# Patient Record
Sex: Male | Born: 2000 | Race: White | Hispanic: No | Marital: Single | Smoking: Never smoker
Health system: Southern US, Community
[De-identification: ages and names within clinical notes are randomized; demographics above are authoritative.]

## PROBLEM LIST (undated history)

## (undated) DIAGNOSIS — F329 Major depressive disorder, single episode, unspecified: Secondary | ICD-10-CM

## (undated) DIAGNOSIS — F32A Depression, unspecified: Secondary | ICD-10-CM

## (undated) DIAGNOSIS — F909 Attention-deficit hyperactivity disorder, unspecified type: Secondary | ICD-10-CM

## (undated) DIAGNOSIS — F419 Anxiety disorder, unspecified: Secondary | ICD-10-CM

## (undated) DIAGNOSIS — M4850XA Collapsed vertebra, not elsewhere classified, site unspecified, initial encounter for fracture: Secondary | ICD-10-CM

## (undated) HISTORY — DX: Major depressive disorder, single episode, unspecified: F32.9

## (undated) HISTORY — DX: Attention-deficit hyperactivity disorder, unspecified type: F90.9

## (undated) HISTORY — PX: NO PAST SURGERIES: SHX2092

## (undated) HISTORY — DX: Collapsed vertebra, not elsewhere classified, site unspecified, initial encounter for fracture: M48.50XA

## (undated) HISTORY — DX: Depression, unspecified: F32.A

## (undated) HISTORY — DX: Anxiety disorder, unspecified: F41.9

---
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
## 2020-01-14 ENCOUNTER — Other Ambulatory Visit: Payer: Self-pay

## 2020-01-14 ENCOUNTER — Telehealth (INDEPENDENT_AMBULATORY_CARE_PROVIDER_SITE_OTHER): Payer: BC Managed Care – PPO | Admitting: Psychiatry

## 2020-01-14 DIAGNOSIS — F3181 Bipolar II disorder: Secondary | ICD-10-CM

## 2020-01-14 DIAGNOSIS — F902 Attention-deficit hyperactivity disorder, combined type: Secondary | ICD-10-CM

## 2020-01-14 MED ORDER — ARIPIPRAZOLE 5 MG PO TABS: 10.0000 mg | ORAL_TABLET | Freq: Every day | ORAL | 1 refills | Status: DC

## 2020-01-14 NOTE — Progress Notes (Signed)
BH MD/PA/NP OP Progress Note  01/14/2020 3:48 PM Shane [name]  MRN:  [id number] Interview was conducted using teleconferencing application and I verified that I was speaking with the correct person using two identifiers. I discussed the limitations of evaluation and management by telemedicine and  the availability of in person appointments. Patient expressed understanding and agreed to proceed.  Chief Complaint: "I am doing well but have gained weight".  HPI: 19yo single white male with a hx of mood swings, irritability, anger then some depression/anxiety consistent with bipolar spectrum disorder. He also has been diagnosed with ADHD and was in the past on Vyvanse (suppressed appetite) and is doing well on Strattera. He has a hx of cannabis abuse but has not been using any drugs in over 2 years. He stopped taking his meds "for a while" and this caused reemergence ofproblems with mood being more irritable, more angry outbursts,impulsiveand had excessive spending.In mid June 2020 he resumed taking medsconsistently with parent supervising, and his moodstarted to improve. Hewas obsessing about his health and we increased Prozac further to 60 mg. While obsessive tendencies subsided his mood became unstable - more irritability, renewed problems with compliance. After we decreased Prozac to 40 mg and increased Lamictal to 200 mg his mood normalized. His meds includeLamictal 200mg  qam,Abilify 10 mg qam, fluoxetine 40 mg andStrattera 80mg  qam.  He has a tendency to obsess/ruminate about his health - hypochondriasis not a major issue at this time. Parents are supportive and he can continue to live at home as long as he follows rules including taking his meds.He reports feeling emotionally stable at this time. He is sleeping well at night without daytime sedation. He stays active - works at . He has gained some weight and admits to spending "too much money on food". In the past when he was  on 5 mg of aripiprazole it was less of a problem.   Visit Diagnosis:    ICD-10-CM   1. Bipolar 2 disorder (HCC)  F31.81   2. Attention deficit hyperactivity disorder (ADHD), combined type  F90.2     Past Psychiatric History: Please see intake H&P.  Past Medical History:  Past Medical History:  Diagnosis Date  . ADHD   . Anxiety   . Compressed spine fracture (HCC) 2015   L6  . Depression     Past Surgical History:  Procedure Laterality Date  . NO PAST SURGERIES      Family Psychiatric History: Reviewed.  Family History:  Family History  Problem Relation Age of Onset  . Thyroid disease Mother   . Anxiety disorder Mother   . Depression Mother   . Hypertension Father   . Hyperlipidemia Father   . Hypertension Paternal Grandmother   . Hyperlipidemia Paternal Grandfather     Social History:  Social History   Socioeconomic History  . Marital status: Single    Spouse name: Not on file  . Number of children: Not on file  . Years of education: Not on file  . Highest education level: Not on file  Occupational History  . Not on file  Tobacco Use  . Smoking status: Never Smoker  . Smokeless tobacco: Never Used  Substance and Sexual Activity  . Alcohol use: No  . Drug use: No  . Sexual activity: Never  Other Topics Concern  . Not on file  Social History Narrative  . Not on file   Social Determinants of Health   Financial Resource Strain:   . Difficulty of  Paying Living Expenses:   Food Insecurity:   . Worried About Charity fundraiser in the Last Year:   . Arboriculturist in the Last Year:   Transportation Needs:   . Film/video editor (Medical):   Marland Kitchen Lack of Transportation (Non-Medical):   Physical Activity:   . Days of Exercise per Week:   . Minutes of Exercise per Session:   Stress:   . Feeling of Stress :   Social Connections:   . Frequency of Communication with Friends and Family:   . Frequency of Social Gatherings with Friends and Family:    . Attends Religious Services:   . Active Member of Clubs or Organizations:   . Attends Archivist Meetings:   Marland Kitchen Marital Status:     Allergies:  Allergies  Allergen Reactions  . Amoxicillin   . Cantaloupe (Diagnostic)     Metabolic Disorder Labs: Lab Results  Component Value Date   HGBA1C 5.0 05/02/2017   MPG 97 05/02/2017   No results found for: PROLACTIN Lab Results  Component Value Date   CHOL 162 05/02/2017   TRIG 80 05/02/2017   HDL 37 (L) 05/02/2017   CHOLHDL 4.4 05/02/2017   Lab Results  Component Value Date   TSH 2.44 09/20/2018    Therapeutic Level Labs: No results found for: LITHIUM No results found for: VALPROATE No components found for:  CBMZ  Current Medications: Current Outpatient Medications  Medication Sig Dispense Refill  . ARIPiprazole (ABILIFY) 5 MG tablet Take 2 tablets (10 mg total) by mouth daily. 90 tablet 1  . atomoxetine (STRATTERA) 80 MG capsule Take 1 capsule (80 mg total) by mouth daily. 30 capsule 5  . FLUoxetine (PROZAC) 40 MG capsule Take 1 capsule (40 mg total) by mouth daily. 90 capsule 0  . fluticasone (FLONASE) 50 MCG/ACT nasal spray Place 1 spray into both nostrils daily. 16 g 0  . ibuprofen (ADVIL,MOTRIN) 800 MG tablet Take 1 tablet (800 mg total) by mouth 3 (three) times daily. 21 tablet 0  . lamoTRIgine (LAMICTAL) 200 MG tablet Take 1 tablet (200 mg total) by mouth at bedtime. 90 tablet 0  . loratadine (CLARITIN) 10 MG tablet Take 10 mg by mouth daily.    . meloxicam (MOBIC) 15 MG tablet Take 1 tablet (15 mg total) by mouth daily. 30 tablet 0   No current facility-administered medications for this visit.    Psychiatric Specialty Exam: Review of Systems  Constitutional: Positive for appetite change.  All other systems reviewed and are negative.   There were no vitals taken for this visit.There is no height or weight on file to calculate BMI.  General Appearance: Casual and Fairly Groomed  Eye Contact:  Good   Speech:  Clear and Coherent and Normal Rate  Volume:  Normal  Mood:  Euthymic  Affect:  Full Range  Thought Process:  Goal Directed and Linear  Orientation:  Full (Time, Place, and Person)  Thought Content: Logical   Suicidal Thoughts:  No  Homicidal Thoughts:  No  Memory:  Immediate;   Good Recent;   Good Remote;   Good  Judgement:  Fair  Insight:  Fair  Psychomotor Activity:  Normal  Concentration:  Concentration: Good  Recall:  Good  Fund of Knowledge: Good  Language: Good  Akathisia:  Negative  Handed:  Right  AIMS (if indicated): not done  Assets:  Communication Skills Desire for Improvement Financial Resources/Insurance Housing Physical Health Social Support  ADL's:  Intact  Cognition: WNL  Sleep:  Good   Screenings: GAD-7     Counselor from 01/09/2017 in BEHAVIORAL HEALTH OUTPATIENT THERAPY [place]  Total GAD-7 Score  11    PHQ2-9     Counselor from 01/09/2017 in BEHAVIORAL HEALTH OUTPATIENT THERAPY [place]  PHQ-2 Total Score  1  PHQ-9 Total Score  6       Assessment and Plan: 19yo single white male with a hx of mood swings, irritability, anger then some depression/anxiety consistent with bipolar spectrum disorder. He also has been diagnosed with ADHD and was in the past on Vyvanse (suppressed appetite) and is doing well on Strattera. He has a hx of cannabis abuse but has not been using any drugs in over 2 years. He stopped taking his meds "for a while" and this caused reemergence ofproblems with mood being more irritable, more angry outbursts,impulsiveand had excessive spending.In mid June 2020 he resumed taking medsconsistently with parent supervising, and his moodstarted to improve. Hewas obsessing about his health and we increased Prozac further to 60 mg. While obsessive tendencies subsided his mood became unstable - more irritability, renewed problems with compliance. After we decreased Prozac to 40 mg and increased Lamictal to 200 mg his mood  normalized. His meds includeLamictal 200mg  qam,Abilify 10 mg qam, fluoxetine 40 mg andStrattera 80mg  qam.  He has a tendency to obsess/ruminate about his health - hypochondriasis not a major issue at this time. Parents are supportive and he can continue to live at home as long as he follows rules including taking his meds.He reports feeling emotionally stable at this time. He is sleeping well at night without daytime sedation. He stays active - works at . He has gained some weight and admits to spending "too much money on food". In the past when he was on 5 mg of aripiprazole it was less of a problem.  Dx: ADHD combined;Bipolar 2 disorder.  Plan:Continue Prozacto 40 mg,Lamictalto 200 mg at HS, decrease Abilifyto 5 mg in AM andcontinue Strattera 80mg  qam.Next appointment intwomonths.The plan was discussed with patientwho had an opportunity to ask questions and these were all answered. I spend51minutes inphone consultationwith the patient.    [employer], MD 01/14/2020, 3:48 PM

## 2020-02-06 ENCOUNTER — Other Ambulatory Visit (HOSPITAL_COMMUNITY): Payer: Self-pay | Admitting: Psychiatry

## 2020-03-09 ENCOUNTER — Other Ambulatory Visit (HOSPITAL_COMMUNITY): Payer: Self-pay | Admitting: Psychiatry

## 2020-03-17 ENCOUNTER — Telehealth (HOSPITAL_COMMUNITY): Payer: BC Managed Care – PPO | Admitting: Psychiatry

## 2020-03-17 ENCOUNTER — Other Ambulatory Visit: Payer: Self-pay

## 2020-04-01 ENCOUNTER — Other Ambulatory Visit: Payer: Self-pay

## 2020-04-01 ENCOUNTER — Ambulatory Visit (INDEPENDENT_AMBULATORY_CARE_PROVIDER_SITE_OTHER): Payer: BC Managed Care – PPO | Admitting: Family Medicine

## 2020-04-01 ENCOUNTER — Encounter: Payer: Self-pay | Admitting: Family Medicine

## 2020-04-01 VITALS — BP 118/84 | HR 84 | Temp 99.1°F | Ht 70.0 in | Wt 207.1 lb

## 2020-04-01 DIAGNOSIS — Z0000 Encounter for general adult medical examination without abnormal findings: Secondary | ICD-10-CM | POA: Diagnosis not present

## 2020-04-01 DIAGNOSIS — Z1159 Encounter for screening for other viral diseases: Secondary | ICD-10-CM

## 2020-04-01 DIAGNOSIS — Z114 Encounter for screening for human immunodeficiency virus [HIV]: Secondary | ICD-10-CM | POA: Diagnosis not present

## 2020-04-01 NOTE — Patient Instructions (Addendum)
Give us 2-3 business days to get the results of your labs back.   Keep the diet clean and stay active.  Do monthly self testicular checks in the shower. You are feeling for lumps/bumps that don't belong. If you feel anything like this, let me know!  Let us know if you need anything.  Healthy Eating Plan Many factors influence your heart health, including eating and exercise habits. Heart (coronary) risk increases with abnormal blood fat (lipid) levels. Heart-healthy meal planning includes limiting unhealthy fats, increasing healthy fats, and making other small dietary changes. This includes maintaining a healthy body weight to help keep lipid levels within a normal range.  WHAT IS MY PLAN?  Your health care provider recommends that you: Drink a glass of water before meals to help with satiety. Eat slowly. An alternative to the water is to add Metamucil. This will help with satiety as well. It does contain calories, unlike water.  WHAT TYPES OF FAT SHOULD I CHOOSE? Choose healthy fats more often. Choose monounsaturated and polyunsaturated fats, such as olive oil and canola oil, flaxseeds, walnuts, almonds, and seeds. Eat more omega-3 fats. Good choices include salmon, mackerel, sardines, tuna, flaxseed oil, and ground flaxseeds. Aim to eat fish at least two times each week. Avoid foods with partially hydrogenated oils in them. These contain trans fats. Examples of foods that contain trans fats are stick margarine, some tub margarines, cookies, crackers, and other baked goods. If you are going to avoid a fat, this is the one to avoid!  WHAT GENERAL GUIDELINES DO I NEED TO FOLLOW? Check food labels carefully to identify foods with trans fats. Avoid these types of options when possible. Fill one half of your plate with vegetables and green salads. Eat 4-5 servings of vegetables per day. A serving of vegetables equals 1 cup of raw leafy vegetables,  cup of raw or cooked cut-up vegetables, or   cup of vegetable juice. Fill one fourth of your plate with whole grains. Look for the word "whole" as the first word in the ingredient list. Fill one fourth of your plate with lean protein foods. Eat 4-5 servings of fruit per day. A serving of fruit equals one medium whole fruit,  cup of dried fruit,  cup of fresh, frozen, or canned fruit. Try to avoid fruits in cups/syrups as the sugar content can be high. Eat more foods that contain soluble fiber. Examples of foods that contain this type of fiber are apples, broccoli, carrots, beans, peas, and barley. Aim to get 20-30 g of fiber per day. Eat more home-cooked food and less restaurant, buffet, and fast food. Limit or avoid alcohol. Limit foods that are high in starch and sugar. Avoid fried foods when able. Cook foods by using methods other than frying. Baking, boiling, grilling, and broiling are all great options. Other fat-reducing suggestions include: Removing the skin from poultry. Removing all visible fats from meats. Skimming the fat off of stews, soups, and gravies before serving them. Steaming vegetables in water or broth. Lose weight if you are overweight. Losing just 5-10% of your initial body weight can help your overall health and prevent diseases such as diabetes and heart disease. Increase your consumption of nuts, legumes, and seeds to 4-5 servings per week. One serving of dried beans or legumes equals  cup after being cooked, one serving of nuts equals 1 ounces, and one serving of seeds equals  ounce or 1 tablespoon.  WHAT ARE GOOD FOODS CAN I EAT? Grains Grainy   equals 1 ounces, and one serving of seeds equals  ounce or 1 tablespoon.  WHAT ARE GOOD FOODS CAN I EAT? Grains Grainy breads (try to find bread that is 3 g of fiber per slice or greater), oatmeal, light popcorn. Whole-grain cereals. Rice and pasta, including brown rice and those that are made with whole wheat. Edamame pasta is a great alternative to grain pasta. It has a higher protein content. Try to avoid significant consumption of white  bread, sugary cereals, or pastries/baked goods.  Vegetables All vegetables. Cooked white potatoes do not count as vegetables.  Fruits All fruits, but limit pineapple and bananas as these fruits have a higher sugar content.  Meats and Other Protein Sources Lean, well-trimmed beef, veal, pork, and lamb. Chicken and [place] without skin. All fish and shellfish. Wild duck, rabbit, pheasant, and venison. Egg whites or low-cholesterol egg substitutes. Dried beans, peas, lentils, and tofu.Seeds and most nuts.  Dairy Low-fat or nonfat cheeses, including ricotta, string, and mozzarella. Skim or 1% milk that is liquid, powdered, or evaporated. Buttermilk that is made with low-fat milk. Nonfat or low-fat yogurt. Soy/Almond milk are good alternatives if you cannot handle dairy.  Beverages Water is the best for you. Sports drinks with less sugar are more desirable unless you are a highly active athlete.  Sweets and Desserts Sherbets and fruit ices. Honey, jam, marmalade, jelly, and syrups. Dark chocolate.  Eat all sweets and desserts in moderation.  Fats and Oils Nonhydrogenated (trans-free) margarines. Vegetable oils, including soybean, sesame, sunflower, olive, peanut, safflower, corn, canola, and cottonseed. Salad dressings or mayonnaise that are made with a vegetable oil. Limit added fats and oils that you use for cooking, baking, salads, and as spreads.  Other Cocoa powder. Coffee and tea. Most condiments.  The items listed above may not be a complete list of recommended foods or beverages. Contact your dietitian for more options.

## 2020-04-01 NOTE — Progress Notes (Signed)
Chief Complaint  Patient presents with   Annual Exam    Concerned about BP and diet change    Well Male Shane [name] is here for a complete physical.   His last physical was >1 year ago.  Current diet: in general, an "unhealthy" diet.   Current exercise: plays basketball Weight trend: stable Does pt snore? No. Daytime fatigue? No. Seat belt? Yes.    Health maintenance Tetanus- Yes HIV- No  Past Medical History:  Diagnosis Date   ADHD    Anxiety    Compressed spine fracture (HCC) 2015   L6   Depression      Past Surgical History:  Procedure Laterality Date   NO PAST SURGERIES      Medications  Current Outpatient Medications on File Prior to Visit  Medication Sig Dispense Refill   ARIPiprazole (ABILIFY) 5 MG tablet Take 2 tablets (10 mg total) by mouth daily. 90 tablet 1   atomoxetine (STRATTERA) 80 MG capsule TAKE 1 CAPSULE BY MOUTH EVERY DAY 90 capsule 1   FLUoxetine (PROZAC) 40 MG capsule TAKE 1 CAPSULE BY MOUTH EVERY DAY 90 capsule 0   fluticasone (FLONASE) 50 MCG/ACT nasal spray Place 1 spray into both nostrils daily. 16 g 0   ibuprofen (ADVIL,MOTRIN) 800 MG tablet Take 1 tablet (800 mg total) by mouth 3 (three) times daily. 21 tablet 0   lamoTRIgine (LAMICTAL) 200 MG tablet TAKE 1 TABLET (200 MG TOTAL) BY MOUTH AT BEDTIME. 90 tablet 0   loratadine (CLARITIN) 10 MG tablet Take 10 mg by mouth daily.     Allergies Allergies  Allergen Reactions   Amoxicillin    Cantaloupe (Diagnostic)     Family History Family History  Problem Relation Age of Onset   Thyroid disease Mother    Anxiety disorder Mother    Depression Mother    Hypertension Father    Hyperlipidemia Father    Hypertension Paternal Grandmother    Hyperlipidemia Paternal Grandfather     Review of Systems: Constitutional: no fevers or chills Eye:  no recent significant change in vision Ear/Nose/Mouth/Throat:  Ears:  no hearing loss Nose/Mouth/Throat:  no complaints  of nasal congestion, no sore throat Cardiovascular:  No current chest pain Respiratory:  no shortness of breath Gastrointestinal:  no abdominal pain, no change in bowel habits GU:  Male: negative for dysuria, frequency, and incontinence Musculoskeletal/Extremities:  no pain of the joints Integumentary (Skin/Breast):  no abnormal skin lesions reported Neurologic:  no headaches Endocrine: No unexpected weight changes Hematologic/Lymphatic:  no night sweats  Exam BP 118/84 (BP Location: Left Arm, Patient Position: Sitting, Cuff Size: Normal)    Pulse 84    Temp 99.1 F (37.3 C) (Oral)    Ht 5\' 10"  (1.778 m)    Wt 207 lb 2 oz (94 kg)    SpO2 96%    BMI 29.72 kg/m  General:  well developed, well nourished, in no apparent distress Skin:  no significant moles, warts, or growths Head:  no masses, lesions, or tenderness Eyes:  pupils equal and round, sclera anicteric without injection Ears:  canals without lesions, TMs shiny without retraction, no obvious effusion, no erythema Nose:  nares patent, septum midline, mucosa normal Throat/Pharynx:  lips and gingiva without lesion; tongue and uvula midline; non-inflamed pharynx; no exudates or postnasal drainage Neck: neck supple without adenopathy, thyromegaly, or masses Lungs:  clear to auscultation, breath sounds equal bilaterally, no respiratory distress Cardio:  regular rate and rhythm, no bruits, no LE edema Abdomen:  abdomen soft, nontender; bowel sounds normal; no masses or organomegaly Rectal: Deferred Musculoskeletal:  symmetrical muscle groups noted without atrophy or deformity Extremities:  no clubbing, cyanosis, or edema, no deformities, no skin discoloration Neuro:  gait normal; deep tendon reflexes normal and symmetric Psych: well oriented with normal range of affect and appropriate judgment/insight  Assessment and Plan  Well adult exam - Plan: Hemoglobin A1c, Lipid panel, CBC, Comprehensive metabolic panel  Encounter for  hepatitis C screening test for low risk patient - Plan: Hepatitis C antibody  Screening for HIV (human immunodeficiency virus) - Plan: HIV Antibody (routine testing w rflx)   Well 19 y.o. male. Counseled on diet and exercise. Self testicular exams recommended at least monthly.  BP OK today. Monitor at home. Let us know if high and we can have nurse visit to ck accuracy of monitor.  Other orders as above. Follow up in 1 year pending the above workup. The patient voiced understanding and agreement to the plan.  Jilda Roche [place], DO 04/01/20 3:35 PM

## 2020-04-02 LAB — LIPID PANEL
Cholesterol: 208 mg/dL — ABNORMAL HIGH (ref 0–200)
HDL: 40.9 mg/dL (ref >39.00)
LDL Cholesterol: 156 mg/dL — ABNORMAL HIGH (ref 0–99)
NonHDL: 166.68
Total CHOL/HDL Ratio: 5
Triglycerides: 51 mg/dL (ref 0.0–149.0)
VLDL: 10.2 mg/dL (ref 0.0–40.0)

## 2020-04-02 LAB — COMPREHENSIVE METABOLIC PANEL
ALT: 18 U/L (ref 0–53)
AST: 17 U/L (ref 0–37)
Albumin: 4.9 g/dL (ref 3.5–5.2)
Alkaline Phosphatase: 95 U/L (ref 52–171)
BUN: 24 mg/dL — ABNORMAL HIGH (ref 6–23)
CO2: 30 mEq/L (ref 19–32)
Calcium: 9.8 mg/dL (ref 8.4–10.5)
Chloride: 103 mEq/L (ref 96–112)
Creatinine, Ser: 1.31 mg/dL (ref 0.40–1.50)
GFR: 70.1 mL/min (ref >60.00)
Glucose, Bld: 99 mg/dL (ref 70–99)
Potassium: 5.1 mEq/L (ref 3.5–5.1)
Sodium: 141 mEq/L (ref 135–145)
Total Bilirubin: 0.7 mg/dL (ref 0.2–1.2)
Total Protein: 6.9 g/dL (ref 6.0–8.3)

## 2020-04-02 LAB — HIV ANTIBODY (ROUTINE TESTING W REFLEX): HIV 1&2 Ab, 4th Generation: NONREACTIVE

## 2020-04-02 LAB — CBC
HCT: 44.5 % (ref 36.0–49.0)
Hemoglobin: 15.4 g/dL (ref 12.0–16.0)
MCHC: 34.6 g/dL (ref 31.0–37.0)
MCV: 94.1 fl (ref 78.0–98.0)
Platelets: 210 10*3/uL (ref 150.0–575.0)
RBC: 4.73 Mil/uL (ref 3.80–5.70)
RDW: 12.4 % (ref 11.4–15.5)
WBC: 5.4 10*3/uL (ref 4.5–13.5)

## 2020-04-02 LAB — HEPATITIS C ANTIBODY
Hepatitis C Ab: NONREACTIVE
SIGNAL TO CUT-OFF: 0.03 (ref <1.00)

## 2020-04-02 LAB — HEMOGLOBIN A1C: Hgb A1c MFr Bld: 5.4 % (ref 4.6–6.5)

## 2020-04-03 ENCOUNTER — Other Ambulatory Visit: Payer: Self-pay | Admitting: Family Medicine

## 2020-04-03 DIAGNOSIS — E785 Hyperlipidemia, unspecified: Secondary | ICD-10-CM

## 2020-04-09 ENCOUNTER — Other Ambulatory Visit (HOSPITAL_COMMUNITY): Payer: Self-pay | Admitting: Psychiatry

## 2020-04-09 MED ORDER — ARIPIPRAZOLE 5 MG PO TABS
5.0000 mg | ORAL_TABLET | Freq: Every day | ORAL | 1 refills | Status: DC
Start: 2020-04-09 — End: 2020-07-15

## 2020-04-24 ENCOUNTER — Other Ambulatory Visit (HOSPITAL_COMMUNITY): Payer: Self-pay | Admitting: Psychiatry

## 2020-04-29 ENCOUNTER — Other Ambulatory Visit (HOSPITAL_COMMUNITY): Payer: Self-pay | Admitting: Psychiatry

## 2020-05-07 ENCOUNTER — Emergency Department (HOSPITAL_COMMUNITY)
Admission: EM | Admit: 2020-05-07 | Discharge: 2020-05-07 | Disposition: A | Discharge status: Home / Self Care | Payer: BC Managed Care – PPO | Attending: Emergency Medicine | Admitting: Emergency Medicine

## 2020-05-07 ENCOUNTER — Other Ambulatory Visit: Payer: Self-pay

## 2020-05-07 DIAGNOSIS — Z5321 Procedure and treatment not carried out due to patient leaving prior to being seen by health care provider: Secondary | ICD-10-CM | POA: Diagnosis not present

## 2020-05-07 DIAGNOSIS — S0990XA Unspecified injury of head, initial encounter: Secondary | ICD-10-CM | POA: Diagnosis present

## 2020-05-07 DIAGNOSIS — W2105XA Struck by basketball, initial encounter: Secondary | ICD-10-CM | POA: Diagnosis not present

## 2020-05-07 DIAGNOSIS — R519 Headache, unspecified: Secondary | ICD-10-CM | POA: Diagnosis not present

## 2020-05-07 DIAGNOSIS — Y92310 Basketball court as the place of occurrence of the external cause: Secondary | ICD-10-CM | POA: Diagnosis not present

## 2020-05-07 DIAGNOSIS — Y999 Unspecified external cause status: Secondary | ICD-10-CM | POA: Insufficient documentation

## 2020-05-07 DIAGNOSIS — R55 Syncope and collapse: Secondary | ICD-10-CM | POA: Diagnosis not present

## 2020-05-07 DIAGNOSIS — R110 Nausea: Secondary | ICD-10-CM | POA: Diagnosis not present

## 2020-05-07 DIAGNOSIS — Y9367 Activity, basketball: Secondary | ICD-10-CM | POA: Insufficient documentation

## 2020-05-07 DIAGNOSIS — S0181XA Laceration without foreign body of other part of head, initial encounter: Secondary | ICD-10-CM | POA: Insufficient documentation

## 2020-05-07 DIAGNOSIS — R410 Disorientation, unspecified: Secondary | ICD-10-CM | POA: Insufficient documentation

## 2020-05-07 LAB — BASIC METABOLIC PANEL
Anion gap: 9 (ref 5–15)
BUN: 18 mg/dL (ref 6–20)
CO2: 26 mmol/L (ref 22–32)
Calcium: 9.1 mg/dL (ref 8.9–10.3)
Chloride: 104 mmol/L (ref 98–111)
Creatinine, Ser: 1.13 mg/dL (ref 0.61–1.24)
GFR calc Af Amer: >60 mL/min (ref >60)
GFR calc non Af Amer: >60 mL/min (ref >60)
Glucose, Bld: 92 mg/dL (ref 70–99)
Potassium: 4.4 mmol/L (ref 3.5–5.1)
Sodium: 139 mmol/L (ref 135–145)

## 2020-05-07 LAB — CBC
HCT: 46.4 % (ref 39.0–52.0)
Hemoglobin: 15.6 g/dL (ref 13.0–17.0)
MCH: 31.8 pg (ref 26.0–34.0)
MCHC: 33.6 g/dL (ref 30.0–36.0)
MCV: 94.5 fL (ref 80.0–100.0)
Platelets: 214 10*3/uL (ref 150–400)
RBC: 4.91 MIL/uL (ref 4.22–5.81)
RDW: 11.9 % (ref 11.5–15.5)
WBC: 6 10*3/uL (ref 4.0–10.5)
nRBC: 0 % (ref 0.0–0.2)

## 2020-05-07 NOTE — ED Triage Notes (Signed)
Pt was hit in the head while moving a basketball goal last night (small lac to R forehead). Headache when he went to bed last night. Woke up this morning with headache and nausea. Had syncopal episode at work this morning while standing up to urinate. Still c/o headache, nausea, and has some mild confusion. C collar still in place. 4 mg zofran given PTA.

## 2020-05-08 ENCOUNTER — Ambulatory Visit (HOSPITAL_BASED_OUTPATIENT_CLINIC_OR_DEPARTMENT_OTHER)
Admission: RE | Admit: 2020-05-08 | Discharge: 2020-05-08 | Disposition: A | Discharge status: Home / Self Care | Payer: BC Managed Care – PPO | Source: Ambulatory Visit | Attending: Medical | Admitting: Medical

## 2020-05-08 ENCOUNTER — Ambulatory Visit (INDEPENDENT_AMBULATORY_CARE_PROVIDER_SITE_OTHER): Payer: BC Managed Care – PPO | Admitting: Medical

## 2020-05-08 VITALS — BP 124/80 | HR 107 | Resp 18 | Ht 70.0 in | Wt 206.4 lb

## 2020-05-08 DIAGNOSIS — R519 Headache, unspecified: Secondary | ICD-10-CM

## 2020-05-08 DIAGNOSIS — M546 Pain in thoracic spine: Secondary | ICD-10-CM | POA: Insufficient documentation

## 2020-05-08 DIAGNOSIS — M545 Low back pain, unspecified: Secondary | ICD-10-CM

## 2020-05-08 DIAGNOSIS — R55 Syncope and collapse: Secondary | ICD-10-CM | POA: Diagnosis not present

## 2020-05-08 DIAGNOSIS — R42 Dizziness and giddiness: Secondary | ICD-10-CM | POA: Insufficient documentation

## 2020-05-08 DIAGNOSIS — J3489 Other specified disorders of nose and nasal sinuses: Secondary | ICD-10-CM

## 2020-05-08 LAB — COMPREHENSIVE METABOLIC PANEL
AG Ratio: 2.3 (calc) (ref 1.0–2.5)
ALT: 18 U/L (ref 8–46)
AST: 18 U/L (ref 12–32)
Albumin: 4.8 g/dL (ref 3.6–5.1)
Alkaline phosphatase (APISO): 85 U/L (ref 46–169)
BUN: 17 mg/dL (ref 7–20)
CO2: 29 mmol/L (ref 20–32)
Calcium: 9.6 mg/dL (ref 8.9–10.4)
Chloride: 102 mmol/L (ref 98–110)
Creat: 1.12 mg/dL (ref 0.60–1.26)
Globulin: 2.1 g/dL (calc) (ref 2.1–3.5)
Glucose, Bld: 94 mg/dL (ref 65–99)
Potassium: 4.7 mmol/L (ref 3.8–5.1)
Sodium: 141 mmol/L (ref 135–146)
Total Bilirubin: 0.4 mg/dL (ref 0.2–1.1)
Total Protein: 6.9 g/dL (ref 6.3–8.2)

## 2020-05-08 LAB — CBC WITH DIFFERENTIAL/PLATELET
Absolute Monocytes: 601 cells/uL (ref 200–950)
Basophils Absolute: 69 cells/uL (ref 0–200)
Basophils Relative: 0.9 %
Eosinophils Absolute: 69 cells/uL (ref 15–500)
Eosinophils Relative: 0.9 %
HCT: 46.8 % (ref 38.5–50.0)
Hemoglobin: 16.1 g/dL (ref 13.2–17.1)
Lymphs Abs: 1571 cells/uL (ref 850–3900)
MCH: 31.8 pg (ref 27.0–33.0)
MCHC: 34.4 g/dL (ref 32.0–36.0)
MCV: 92.3 fL (ref 80.0–100.0)
MPV: 11.5 fL (ref 7.5–12.5)
Monocytes Relative: 7.8 %
Neutro Abs: 5390 cells/uL (ref 1500–7800)
Neutrophils Relative %: 70 %
Platelets: 224 10*3/uL (ref 140–400)
RBC: 5.07 10*6/uL (ref 4.20–5.80)
RDW: 12.6 % (ref 11.0–15.0)
Total Lymphocyte: 20.4 %
WBC: 7.7 10*3/uL (ref 3.8–10.8)

## 2020-05-08 NOTE — Patient Instructions (Signed)
You have recent right frontal region trauma 2 days ago with contusion type injury to the nasal bridge.  At that time described feeling stunned but no loss of consciousness.  Minimal headache later that night.  Then yesterday you had syncope which could have been vasovagal type syncope after standing from seated position following using the bathroom.  However presently you have mild headache with slight dizziness and mild confusion.  Confirm has had 2 concussions within 48 hours.  We will get CT of head stat.  We had to get prior authorization first.  We will get CBC and CMP.  Evaluate if anemia or electrolyte abnormality might have played a role.  Sugar yesterday in ambulance was 99.  EKG shows mild sinus tachycardia.  No other obvious abnormality.  Back pain after a fall.  History of compression fractures in this area when younger.  Will get T-spine lumbar spine x-ray since the pain is at the junction of those areas.  Also will get x-ray of nasal bones.  For mild headache presently can use Tylenol and ibuprofen.  Advised dad and patient to make sure well-hydrated.  He needs to rest his brain and not work for 1 week.  Avoid any injuries or falls.  Postconcussive syndrome can occur if repetitive head injuries happened.  Follow-up in 1 week or as needed.   Concussion, Adult  A concussion is a brain injury from a hard, direct hit (trauma) to your head or body. This direct hit causes the brain to quickly shake back and forth inside the skull. A concussion may also be called a mild traumatic brain injury (TBI). Healing from this injury can take time. What are the causes? This condition is caused by:  A direct hit to your head, such as: ? Running into a player during a game. ? Being hit in a fight. ? Hitting your head on a hard surface.  A quick and sudden movement (jolt) of the head or neck, such as in a car crash. What are the signs or symptoms? The signs of a concussion can be hard  to notice. They may be missed by you, family members, and doctors. You may look fine on the outside but may not act or feel normal. Physical symptoms  Headaches.  Being tired (fatigued).  Being dizzy.  Problems with body balance.  Problems seeing or hearing.  Being sensitive to light or noise.  Feeling sick to your stomach (nausea) or throwing up (vomiting).  Not sleeping or eating as you used to.  Loss of feeling (numbness) or tingling in the body.  Seizure. Mental and emotional symptoms  Problems remembering things.  Trouble focusing your mind (concentrating), organizing, or making decisions.  Being slow to think, act, react, speak, or read.  Feeling grouchy (irritable).  Having mood changes.  Feeling worried or nervous (anxious).  Feeling sad (depressed). How is this treated? This condition may be treated by:  Stopping sports or activity if you are injured. If you hit your head or have signs of concussion: ? Do not return to sports or activities the same day. ? Get checked by a doctor before you return to your activities.  Resting your body and your mind.  Being watched carefully, often at home.  Medicines to help with symptoms such as: ? Feeling sick to your stomach. ? Headaches. ? Problems with sleep.  Avoid taking strong pain medicines (opioids) for a concussion.  Avoiding alcohol and drugs.  Being asked to go to a concussion  clinic or a place to help you recover (rehabilitation center). Recovery from a concussion can take time. Return to activities only:  When you are fully healed.  When your doctor says it is safe. Follow these instructions at home: Activity  Limit activities that need a lot of thought or focus, such as: ? Homework or work for your job. ? Watching TV. ? Using the computer or phone. ? Playing memory games and puzzles.  Rest. Rest helps your brain heal. Make sure you: ? Get plenty of sleep. Most adults should get 7-9  hours of sleep each night. ? Rest during the day. Take naps or breaks when you feel tired.  Avoid activity like exercise until your doctor says its safe. Stop any activity that makes symptoms worse.  Do not do activities that could cause a second concussion, such as riding a bike or playing sports.  Ask your doctor when you can return to your normal activities, such as school, work, sports, and driving. Your ability to react may be slower. Do not do these activities if you are dizzy. General instructions   Take over-the-counter and prescription medicines only as told by your doctor.  Do not drink alcohol until your doctor says you can.  Watch your symptoms and tell other people to do the same. Other problems can occur after a concussion. Older adults have a higher risk of serious problems.  Tell your work Production designer, theatre/television/film, teachers, Tax adviser, school counselor, coach, or Event organiser about your injury and symptoms. Tell them about what you can or cannot do.  Keep all follow-up visits as told by your doctor. This is important. How is this prevented?  It is very important that you do not get another brain injury. In rare cases, another injury can cause brain damage that will not go away, brain swelling, or death. The risk of this is greatest in the first 7-10 days after a head injury. To avoid injuries: ? Stop activities that could lead to a second concussion, such as contact sports, until your doctor says it is okay. ? When you return to sports or activities:  Do not crash into other players. This is how most concussions happen.  Follow the rules.  Respect other players. ? Get regular exercise. Do strength and balance training. ? Wear a helmet that fits you well during sports, biking, or other activities.  Helmets can help protect you from serious skull and brain injuries, but they do not protect you from a concussion. Even when wearing a helmet, you should avoid being hit in the  head. Contact a doctor if:  Your symptoms get worse or they do not get better.  You have new symptoms.  You have another injury. Get help right away if:  You have bad headaches or your headaches get worse.  You feel weak or numb in any part of your body.  You are mixed up (confused).  Your balance gets worse.  You keep throwing up.  You feel more sleepy than normal.  Your speech is not clear (is slurred).  You cannot recognize people or places.  You have a seizure.  Others have trouble waking you up.  You have behavior changes.  You have changes in how you see (vision).  You pass out (lose consciousness). Summary  A concussion is a brain injury from a hard, direct hit (trauma) to your head or body.  This condition is treated with rest and careful watching of symptoms.  If you keep  having symptoms, call your doctor. This information is not intended to replace advice given to you by your health care provider. Make sure you discuss any questions you have with your health care provider. Document Revised: 04/26/2018 Document Reviewed: 04/26/2018 Elsevier Patient Education  2020 [employer].

## 2020-05-08 NOTE — Progress Notes (Signed)
Subjective:    Patient ID: Shane [name], male    DOB: 06/11/2001, 19 y.o.   MRN: [id number]  HPI  Pt in today stating yesterday at work he had a fall.  Pt had just got thru using the bathroom/urinating at work. He stood up and passed out(he was not sure how long he was out).  [facility] to [facility] evaluated by [facility]. He was told would need to wait 24 hours before being seen. So he left. Pt was not sure how long he was out.  He does mention Wednesday 6-7 am he was shooting basketball at his house got hit by rim of basketball goal. He states goal hit ground and his head got hit by goal hit him in bridge of his nose/eye brow area. He states felt stunned for 10-15 seconds then got ha later that night. He also felt nauseas.   No frequent urination. Finger stick sugar by ambulance was 99.  After fall now had t spine and lumbar spine pain. He has hx of compression fracture as 7 th grader.    Review of Systems  Constitutional: Negative for chills, fatigue and fever.  HENT:       Nose pain minimal.   Eyes: Negative.   Respiratory: Negative for chest tightness, shortness of breath and wheezing.   Cardiovascular: Negative for chest pain and palpitations.  Gastrointestinal: Negative for abdominal pain, constipation, nausea and vomiting.  Genitourinary: Negative for discharge, dysuria, flank pain and penile pain.  Musculoskeletal: Negative for back pain, myalgias and neck stiffness.  Skin: Negative for rash.  Neurological: Positive for dizziness, syncope and headaches. Negative for speech difficulty, weakness and numbness.       Mild.   Hematological: Negative for adenopathy.  Psychiatric/Behavioral: Negative for behavioral problems. The patient is not nervous/anxious.    Past Medical History:  Diagnosis Date  . ADHD   . Anxiety   . Compressed spine fracture (HCC) 2015   L6  . Depression      Social History   Socioeconomic History  . Marital status: Single    Spouse  name: Not on file  . Number of children: Not on file  . Years of education: Not on file  . Highest education level: Not on file  Occupational History  . Not on file  Tobacco Use  . Smoking status: Never Smoker  . Smokeless tobacco: Never Used  Vaping Use  . Vaping Use: Never used  Substance and Sexual Activity  . Alcohol use: No  . Drug use: No  . Sexual activity: Never  Other Topics Concern  . Not on file  Social History Narrative  . Not on file   Social Determinants of Health   Financial Resource Strain:   . Difficulty of Paying Living Expenses: Not on file  Food Insecurity:   . Worried About Programme researcher, broadcasting/film/video in the Last Year: Not on file  . Ran Out of Food in the Last Year: Not on file  Transportation Needs:   . Lack of Transportation (Medical): Not on file  . Lack of Transportation (Non-Medical): Not on file  Physical Activity:   . Days of Exercise per Week: Not on file  . Minutes of Exercise per Session: Not on file  Stress:   . Feeling of Stress : Not on file  Social Connections:   . Frequency of Communication with Friends and Family: Not on file  . Frequency of Social Gatherings with Friends and Family: Not  on file  . Attends Religious Services: Not on file  . Active Member of Clubs or Organizations: Not on file  . Attends Banker Meetings: Not on file  . Marital Status: Not on file  Intimate Partner Violence:   . Fear of Current or Ex-Partner: Not on file  . Emotionally Abused: Not on file  . Physically Abused: Not on file  . Sexually Abused: Not on file    Past Surgical History:  Procedure Laterality Date  . NO PAST SURGERIES      Family History  Problem Relation Age of Onset  . Thyroid disease Mother   . Anxiety disorder Mother   . Depression Mother   . Hypertension Father   . Hyperlipidemia Father   . Hypertension Paternal Grandmother   . Hyperlipidemia Paternal Grandfather     Allergies  Allergen Reactions  . Amoxicillin     . Cantaloupe (Diagnostic)     Current Outpatient Medications on File Prior to Visit  Medication Sig Dispense Refill  . ARIPiprazole (ABILIFY) 5 MG tablet Take 1 tablet (5 mg total) by mouth daily. 90 tablet 1  . atomoxetine (STRATTERA) 80 MG capsule TAKE 1 CAPSULE BY MOUTH EVERY DAY 90 capsule 1  . FLUoxetine (PROZAC) 40 MG capsule TAKE 1 CAPSULE BY MOUTH EVERY DAY 90 capsule 0  . fluticasone (FLONASE) 50 MCG/ACT nasal spray Place 1 spray into both nostrils daily. 16 g 0  . ibuprofen (ADVIL,MOTRIN) 800 MG tablet Take 1 tablet (800 mg total) by mouth 3 (three) times daily. 21 tablet 0  . lamoTRIgine (LAMICTAL) 200 MG tablet TAKE 1 TABLET (200 MG TOTAL) BY MOUTH AT BEDTIME. 90 tablet 0  . loratadine (CLARITIN) 10 MG tablet Take 10 mg by mouth daily.    . meloxicam (MOBIC) 15 MG tablet Take 1 tablet (15 mg total) by mouth daily. (Patient not taking: Reported on 05/08/2020) 30 tablet 0   No current facility-administered medications on file prior to visit.    BP 124/80   Pulse (!) 107   Resp 18   Ht 5\' 10"  (1.778 m)   Wt 206 lb 6.4 oz (93.6 kg)   SpO2 99%   BMI 29.62 kg/m       Objective:   Physical Exam  General Mental Status- Alert. General Appearance- Not in acute distress.   heent- negtive but mild swelling bridge of nose and tender. No battle signs.  Skin General: Color- Normal Color. Moisture- Normal Moisture.  Neck Carotid Arteries- Normal color. Moisture- Normal Moisture. No carotid bruits. No JVD.  Chest and Lung Exam Auscultation: Breath Sounds:-Normal.  Cardiovascular Auscultation:Rythm- Regular. Murmurs & Other Heart Sounds:Auscultation of the heart reveals- No Murmurs.  Abdomen Inspection:-Inspeection Normal. Palpation/Percussion:Note:No mass. Palpation and Percussion of the abdomen reveal- Non Tender, Non Distended + BS, no rebound or guarding.   Neurologic Cranial Nerve exam:- CN III-XII intact(No nystagmus), symmetric smile. Drift Test:- No  drift. Romberg Exam:- Negative.  Heal to Toe Gait exam:-Normal. Finger to Nose:- Normal/Intact Strength:- 5/5 equal and symmetric strength both upper and lower extremities.      Assessment & Plan:  You have recent right frontal region trauma 2 days ago with contusion type injury to the nasal bridge.  At that time described feeling stunned but no loss of consciousness.  Minimal headache later that night.  Then yesterday you had syncope which could have been vasovagal type syncope after standing from seated position following using the bathroom.  However presently you have mild headache with  slight dizziness and mild confusion.  Confirm has had 2 concussions within 48 hours.  We will get CT of head stat.  We had to get prior authorization first.  We will get CBC and CMP.  Evaluate if anemia or electrolyte abnormality might have played a role.  Sugar yesterday in ambulance was 99.  EKG shows mild sinus tachycardia.  No other obvious abnormality.  Back pain after a fall.  History of compression fractures in this area when younger.  Will get T-spine lumbar spine x-ray since the pain is at the junction of those areas.  Also will get x-ray of nasal bones.  For mild headache presently can use Tylenol and ibuprofen.  Advised dad and patient to make sure well-hydrated.  He needs to rest his brain and not work for 1 week.  Avoid any injuries or falls.  Postconcussive syndrome can occur if repetitive head injuries happened.  Follow-up in 1 week or as needed.  Esperanza Richters, PA-C   Time spent with patient today was 45+  minutes which consisted of chart revdiew, discussing diagnosis, work up treatment and documentation.

## 2020-05-09 ENCOUNTER — Telehealth: Payer: Self-pay | Admitting: Medical

## 2020-05-09 DIAGNOSIS — S022XXA Fracture of nasal bones, initial encounter for closed fracture: Secondary | ICD-10-CM

## 2020-05-09 NOTE — Telephone Encounter (Signed)
Refer to ent for nasal bone fracture.

## 2020-05-15 ENCOUNTER — Encounter: Payer: Self-pay | Admitting: Family Medicine

## 2020-05-15 ENCOUNTER — Other Ambulatory Visit: Payer: Self-pay

## 2020-05-15 ENCOUNTER — Ambulatory Visit (INDEPENDENT_AMBULATORY_CARE_PROVIDER_SITE_OTHER): Payer: BC Managed Care – PPO | Admitting: Family Medicine

## 2020-05-15 VITALS — BP 108/76 | HR 99 | Temp 98.2°F | Ht 70.0 in | Wt 207.0 lb

## 2020-05-15 DIAGNOSIS — S060X1A Concussion with loss of consciousness of 30 minutes or less, initial encounter: Secondary | ICD-10-CM | POA: Diagnosis not present

## 2020-05-15 NOTE — Progress Notes (Signed)
Chief Complaint  Patient presents with  . Concussion    needs new note for work    Subjective: Patient is a 19 y.o. male here for f/u concussion. Here w mom.   Had a concussion and fx'd his nasal bone. Was feeling well, requested letter to return to work. Yesterday started having severe headaches while at work. Reports improvement overall, around 70%. Playing video games for >1 hr prior to getting headaches. Has not been physically active. Not taking any supps.   Past Medical History:  Diagnosis Date  . ADHD   . Anxiety   . Compressed spine fracture (HCC) 2015   L6  . Depression     Objective: BP 108/76 (BP Location: Left Arm, Patient Position: Sitting, Cuff Size: Normal)   Pulse 99   Temp 98.2 F (36.8 C) (Oral)   Ht 5\' 10"  (1.778 m)   Wt 207 lb (93.9 kg)   SpO2 98%   BMI 29.70 kg/m  General: Awake, appears stated age HEENT: MMM, EOMi Heart: RRR MSK: No tenderness to palpation over the occipital triangle or cervical paraspinal musculature bilaterally Neuro: DTRs equal and symmetric.  No clonus or cerebellar signs.  Speech is fluent and goal-directed. Lungs: CTAB, no rales, wheezes or rhonchi. No accessory muscle use Psych: Age appropriate judgment and insight, normal affect and mood  Assessment and Plan: Concussion with loss of consciousness of 30 minutes or less, initial encounter  Gradual return to activity/concentrating tasks.  I think he is improved overall.  We will give him the weekend off and starting Monday, reduce shifts of 4 hours.  This continue for the next week and hopefully can return to full duty the following Monday, 9/6. A list of supplements was provided in his AVS.  Try to avoid exacerbating activities. The patient and his mother voiced understanding and agreement to the plan.  11/6 [place], DO 05/15/20  8:32 AM

## 2020-05-15 NOTE — Patient Instructions (Addendum)
Fish oil 3 grams daily for 10 days then 2 grams daily  Vitamin D 4000 IU daily  CoQ10 200mg  daily for headaches  Tart cherry extract any dose at night  To help improve COGNITIVE function: Using fish oil/omega 3 that is 1000 mg (or roughly 600 mg EPA/DHA), starting as soon as possible after concussion, take: 3 tabs THREE TIMES a day  for the first 3 days, then (you will smell a little, sorry) 3 tabs TWICE DAILY  for the next 3 days, then 3 tabs ONCE DAILY  for the next 10 days    To help reduce HEADACHES: Coenzyme Q10 160mg  ONCE DAILY Riboflavin/Vitamin B2 400mg  ONCE DAILY Magnesium oxide 400 mg ONE-TWO TIMES DAILY May stop after headaches are resolved.                                                                                               To help with INSOMNIA: Melatonin 3-5mg  AT BEDTIME Tart cherry extract, any dose at night    Other medicines to help decrease inflammation Alpha Lipoic Acid 100mg  TWICE DAILY Turmeric 500mg  twice daily Iron 65mg  elemental daily Vitamin D 4000 IU daily for 2 weeks then 2000 IU daily thereafter.  Try to avoid aggravating activities.  Started out with a 5-10 minute walk. If you feel good, keep going. If not, stop and try again later or the next day. The amount of time prior to headache/symptoms should get longer and longer.  Let me know if anything changes.

## 2020-05-31 ENCOUNTER — Other Ambulatory Visit (HOSPITAL_COMMUNITY): Payer: Self-pay | Admitting: Psychiatry

## 2020-06-15 ENCOUNTER — Encounter: Payer: Self-pay | Admitting: Family Medicine

## 2020-06-15 ENCOUNTER — Ambulatory Visit (INDEPENDENT_AMBULATORY_CARE_PROVIDER_SITE_OTHER): Payer: BC Managed Care – PPO | Admitting: Family Medicine

## 2020-06-15 ENCOUNTER — Other Ambulatory Visit: Payer: Self-pay

## 2020-06-15 VITALS — BP 110/70 | HR 115 | Temp 98.1°F | Ht 70.0 in | Wt 209.1 lb

## 2020-06-15 DIAGNOSIS — B078 Other viral warts: Secondary | ICD-10-CM

## 2020-06-15 DIAGNOSIS — B079 Viral wart, unspecified: Secondary | ICD-10-CM

## 2020-06-15 NOTE — Patient Instructions (Signed)
Do not shower for the rest of the day. When you do wash it, use only soap and water. Do not vigorously scrub. Apply triple antibiotic ointment (like Neosporin) twice daily. Keep the area clean and dry.   Things to look out for: increasing pain not relieved by ibuprofen/acetaminophen, fevers, spreading redness, drainage of pus, or foul odor.  This may come back.   Let us know if you need anything.

## 2020-06-15 NOTE — Progress Notes (Addendum)
Chief Complaint  Patient presents with  . warts on left hand    Shane [name] is a 19 y.o. male here for a skin complaint.  Duration: several years Location: 4th and 5th MCP on L Pruritic? No Painful? No Drainage? No Other associated symptoms: starting to crack/bleed Therapies tried thus far: none  Past Medical History:  Diagnosis Date  . ADHD   . Anxiety   . Compressed spine fracture (HCC) 2015   L6  . Depression     BP 110/70 (BP Location: Left Arm, Patient Position: Sitting, Cuff Size: Normal)   Pulse (!) 115   Temp 98.1 F (36.7 C) (Oral)   Ht 5\' 10"  (1.778 m)   Wt 209 lb 2 oz (94.9 kg)   SpO2 98%   BMI 30.01 kg/m  Gen: awake, alert, appearing stated age Lungs: No accessory muscle use Skin: raised and circular lesions on dorsum of the L 4th and 5th MCP, some cracked skin. They measure 0.4 cm in diameter on 4th MCP and 0.5 cm on the 5th MCP. No drainage, erythema, TTP, fluctuance, excoriation Psych: Age appropriate judgment and insight  Procedure note; benign skin lesion removal x 2 Informed consent was obtained. The area was cleaned with alcohol and injected with 0.3 mL of 1% lidocaine without epinephrine. A Dermablade was slightly bent and used to cut under the area of interest. The area was then cauterized ensuring adequate hemostasis. The area was dressed with triple antibiotic ointment and a bandage. This was repeated on the 5th knuckle.  There were no complications noted. The patient tolerated the procedure well.  Verruca(e) - Plan: PR DESTRUCTION BENIGN LESIONS UP TO 14  Aftercare instructions verbalized and written down.  Patient was warned that this may recur.  Discussed aspirin paste, Compound W, duct tape, watchful waiting, cryotherapy. F/u prn. The patient voiced understanding and agreement to the plan.  [place], DO 06/15/20 11:54 AM

## 2020-07-14 ENCOUNTER — Other Ambulatory Visit (HOSPITAL_COMMUNITY): Payer: Self-pay | Admitting: Psychiatry

## 2020-07-15 ENCOUNTER — Other Ambulatory Visit: Payer: Self-pay

## 2020-07-15 ENCOUNTER — Telehealth (INDEPENDENT_AMBULATORY_CARE_PROVIDER_SITE_OTHER): Payer: BC Managed Care – PPO | Admitting: Psychiatry

## 2020-07-15 DIAGNOSIS — F3181 Bipolar II disorder: Secondary | ICD-10-CM

## 2020-07-15 DIAGNOSIS — F900 Attention-deficit hyperactivity disorder, predominantly inattentive type: Secondary | ICD-10-CM | POA: Diagnosis not present

## 2020-07-15 MED ORDER — ARIPIPRAZOLE 5 MG PO TABS: 5.0000 mg | ORAL_TABLET | Freq: Every day | ORAL | 1 refills | Status: DC

## 2020-07-15 MED ORDER — FLUOXETINE HCL 40 MG PO CAPS
ORAL_CAPSULE | ORAL | 1 refills | Status: DC
Start: 2020-07-15 — End: 2020-11-11

## 2020-07-15 MED ORDER — ATOMOXETINE HCL 80 MG PO CAPS
ORAL_CAPSULE | ORAL | 1 refills | Status: DC
Start: 2020-07-15 — End: 2020-11-11

## 2020-07-15 MED ORDER — LAMOTRIGINE 200 MG PO TABS
200.0000 mg | ORAL_TABLET | Freq: Every day | ORAL | 0 refills | Status: DC
Start: 2020-08-29 — End: 2020-10-29

## 2020-07-15 NOTE — Progress Notes (Signed)
BH MD/PA/NP OP Progress Note  07/15/2020 2:45 PM Shane [name]  MRN:  [id number] Interview was conducted by phone and I verified that I was speaking with the correct person using two identifiers. I discussed the limitations of evaluation and management by telemedicine and  the availability of in person appointments. Patient expressed understanding and agreed to proceed. Patient location - home; physician - home office.  Chief Complaint: None.  HPI: 19yo single white male with a hxof mood swings, irritability, anger then some depression/anxiety consistent with bipolar spectrum disorder. He also has been diagnosed with ADHD and was in the past on Vyvanse (suppressed appetite) and is doing well on Strattera. He has a hx of cannabis abuse but has not been using any drugs in over 2 years. He stopped taking his meds "for a while" and this caused reemergence ofproblems with mood being more irritable, more angry outbursts,impulsiveand had excessive spending.In mid June 2020 he resumed taking medsconsistently with parent supervising, and his moodstarted to improve. Hewas obsessing about his health and we increased Prozac further to 60 mg. While obsessive tendencies subsided his mood became unstable - more irritability, renewed problems with compliance. After we decreased Prozac to 40 mg and increased Lamictal to 200 mg his mood normalized. His meds includeLamictal200mg  qam,Abilify 10 mg qam, fluoxetine 40 mg andStrattera 80mg  qam.  He has a tendency to obsess/ruminate about his health - hypochondriasis not a major issue at this time.Parents are supportive and he can continue to live at home as long as he follows rules including taking his meds.He reports feeling emotionally stable at this time. He is sleeping well at night without daytime sedation. He stays active - works at . He has gained some weight and admits to spending "too much money on food". We decreased dose to 5 mg and he  feels it is now less of a problem. Overall, he feels that his mood has been stable.  Visit Diagnosis:    ICD-10-CM   1. Attention deficit hyperactivity disorder (ADHD), predominantly inattentive type  F90.0   2. Bipolar 2 disorder (HCC)  F31.81     Past Psychiatric History: Please see intake H&P.  Past Medical History:  Past Medical History:  Diagnosis Date  . ADHD   . Anxiety   . Compressed spine fracture (HCC) 2015   L6  . Depression     Past Surgical History:  Procedure Laterality Date  . NO PAST SURGERIES      Family Psychiatric History: Reviewed.  Family History:  Family History  Problem Relation Age of Onset  . Thyroid disease Mother   . Anxiety disorder Mother   . Depression Mother   . Hypertension Father   . Hyperlipidemia Father   . Hypertension Paternal Grandmother   . Hyperlipidemia Paternal Grandfather     Social History:  Social History   Socioeconomic History  . Marital status: Single    Spouse name: Not on file  . Number of children: Not on file  . Years of education: Not on file  . Highest education level: Not on file  Occupational History  . Not on file  Tobacco Use  . Smoking status: Never Smoker  . Smokeless tobacco: Never Used  Vaping Use  . Vaping Use: Never used  Substance and Sexual Activity  . Alcohol use: No  . Drug use: No  . Sexual activity: Never  Other Topics Concern  . Not on file  Social History Narrative  . Not on file  Social Determinants of Health   Financial Resource Strain:   . Difficulty of Paying Living Expenses: Not on file  Food Insecurity:   . Worried About Programme researcher, broadcasting/film/video in the Last Year: Not on file  . Ran Out of Food in the Last Year: Not on file  Transportation Needs:   . Lack of Transportation (Medical): Not on file  . Lack of Transportation (Non-Medical): Not on file  Physical Activity:   . Days of Exercise per Week: Not on file  . Minutes of Exercise per Session: Not on file  Stress:    . Feeling of Stress : Not on file  Social Connections:   . Frequency of Communication with Friends and Family: Not on file  . Frequency of Social Gatherings with Friends and Family: Not on file  . Attends Religious Services: Not on file  . Active Member of Clubs or Organizations: Not on file  . Attends Banker Meetings: Not on file  . Marital Status: Not on file    Allergies:  Allergies  Allergen Reactions  . Amoxicillin   . Cantaloupe (Diagnostic)     Metabolic Disorder Labs: Lab Results  Component Value Date   HGBA1C 5.4 04/01/2020   MPG 97 05/02/2017   No results found for: PROLACTIN Lab Results  Component Value Date   CHOL 208 (H) 04/01/2020   TRIG 51.0 04/01/2020   HDL 40.90 04/01/2020   CHOLHDL 5 04/01/2020   VLDL 10.2 04/01/2020   LDLCALC 156 (H) 04/01/2020   Lab Results  Component Value Date   TSH 2.44 09/20/2018    Therapeutic Level Labs: No results found for: LITHIUM No results found for: VALPROATE No components found for:  CBMZ  Current Medications: Current Outpatient Medications  Medication Sig Dispense Refill  . ARIPiprazole (ABILIFY) 5 MG tablet Take 1 tablet (5 mg total) by mouth daily. 90 tablet 1  . atomoxetine (STRATTERA) 80 MG capsule TAKE 1 CAPSULE BY MOUTH EVERY DAY 90 capsule 1  . FLUoxetine (PROZAC) 40 MG capsule TAKE 1 CAPSULE BY MOUTH EVERY DAY 90 capsule 1  . fluticasone (FLONASE) 50 MCG/ACT nasal spray Place 1 spray into both nostrils daily. 16 g 0  . ibuprofen (ADVIL,MOTRIN) 800 MG tablet Take 1 tablet (800 mg total) by mouth 3 (three) times daily. 21 tablet 0  . [START ON 08/29/2020] lamoTRIgine (LAMICTAL) 200 MG tablet Take 1 tablet (200 mg total) by mouth at bedtime. 90 tablet 0  . loratadine (CLARITIN) 10 MG tablet Take 10 mg by mouth daily.    . meloxicam (MOBIC) 15 MG tablet Take 1 tablet (15 mg total) by mouth daily. 30 tablet 0   No current facility-administered medications for this visit.     Psychiatric  Specialty Exam: Review of Systems  All other systems reviewed and are negative.   There were no vitals taken for this visit.There is no height or weight on file to calculate BMI.  General Appearance: NA  Eye Contact:  NA  Speech:  Clear and Coherent and Normal Rate  Volume:  Normal  Mood:  Euthymic  Affect:  NA  Thought Process:  Goal Directed and Linear  Orientation:  Full (Time, Place, and Person)  Thought Content: Logical   Suicidal Thoughts:  No  Homicidal Thoughts:  No  Memory:  Immediate;   Good Recent;   Good Remote;   Good  Judgement:  Good  Insight:  Good  Psychomotor Activity:  NA  Concentration:  Concentration: Good  Recall:  Good  Fund of Knowledge: Good  Language: Good  Akathisia:  Negative  Handed:  Right  AIMS (if indicated): not done  Assets:  Communication Skills Desire for Improvement Financial Resources/Insurance Housing Physical Health  ADL's:  Intact  Cognition: WNL  Sleep:  Good   Screenings: GAD-7     Counselor from 01/09/2017 in BEHAVIORAL HEALTH OUTPATIENT THERAPY [place]  Total GAD-7 Score 11    PHQ2-9     Counselor from 01/09/2017 in BEHAVIORAL HEALTH OUTPATIENT THERAPY [place]  PHQ-2 Total Score 1  PHQ-9 Total Score 6       Assessment and Plan: 19yo single white male with a hxof mood swings, irritability, anger then some depression/anxiety consistent with bipolar spectrum disorder. He also has been diagnosed with ADHD and was in the past on Vyvanse (suppressed appetite) and is doing well on Strattera. He has a hx of cannabis abuse but has not been using any drugs in over 2 years. He stopped taking his meds "for a while" and this caused reemergence ofproblems with mood being more irritable, more angry outbursts,impulsiveand had excessive spending.In mid June 2020 he resumed taking medsconsistently with parent supervising, and his moodstarted to improve. Hewas obsessing about his health and we increased Prozac further to 60  mg. While obsessive tendencies subsided his mood became unstable - more irritability, renewed problems with compliance. After we decreased Prozac to 40 mg and increased Lamictal to 200 mg his mood normalized. His meds includeLamictal200mg  qam,Abilify 10 mg qam, fluoxetine 40 mg andStrattera 80mg  qam.  He has a tendency to obsess/ruminate about his health - hypochondriasis not a major issue at this time.Parents are supportive and he can continue to live at home as long as he follows rules including taking his meds.He reports feeling emotionally stable at this time. He is sleeping well at night without daytime sedation. He stays active - works at . He has gained some weight and admits to spending "too much money on food". We decreased dose to 5 mg and he feels it is now less of a problem. Overall, he feels that his mood has been stable.  Dx: ADHD;Bipolar 2 disorder.  Plan:ContinueProzacto 40 mg,Lamictalto 200 mg at HS, Abilify5 mg in AM andcontinue Strattera 80mg  qam.Next appointment inthreemonths.The plan was discussed with patientwho had an opportunity to ask questions and these were all answered. I spend38minutes inphone consultationwith the patient.   , MD 07/15/2020, 2:45 PM

## 2020-07-21 ENCOUNTER — Encounter: Payer: Self-pay | Admitting: Family Medicine

## 2020-07-21 ENCOUNTER — Ambulatory Visit (INDEPENDENT_AMBULATORY_CARE_PROVIDER_SITE_OTHER): Payer: BC Managed Care – PPO | Admitting: Family Medicine

## 2020-07-21 ENCOUNTER — Ambulatory Visit (HOSPITAL_BASED_OUTPATIENT_CLINIC_OR_DEPARTMENT_OTHER)
Admission: RE | Admit: 2020-07-21 | Discharge: 2020-07-21 | Disposition: A | Discharge status: Home / Self Care | Payer: BC Managed Care – PPO | Source: Ambulatory Visit | Attending: Family Medicine | Admitting: Family Medicine

## 2020-07-21 ENCOUNTER — Other Ambulatory Visit: Payer: Self-pay

## 2020-07-21 VITALS — BP 120/86 | HR 103 | Temp 98.2°F | Ht 69.5 in | Wt 215.0 lb

## 2020-07-21 DIAGNOSIS — R109 Unspecified abdominal pain: Secondary | ICD-10-CM

## 2020-07-21 DIAGNOSIS — R319 Hematuria, unspecified: Secondary | ICD-10-CM

## 2020-07-21 LAB — POC URINALSYSI DIPSTICK (AUTOMATED)
Bilirubin, UA: NEGATIVE
Blood, UA: POSITIVE
Glucose, UA: NEGATIVE
Ketones, UA: NEGATIVE
Leukocytes, UA: NEGATIVE
Nitrite, UA: NEGATIVE
Protein, UA: NEGATIVE
Spec Grav, UA: 1.025 (ref 1.010–1.025)
Urobilinogen, UA: 0.2 E.U./dL
pH, UA: 6 (ref 5.0–8.0)

## 2020-07-21 MED ORDER — HYDROCODONE-ACETAMINOPHEN 5-325 MG PO TABS: 1.0000 | ORAL_TABLET | Freq: Four times a day (QID) | ORAL | 0 refills | Status: DC | PRN

## 2020-07-21 MED ORDER — SULFAMETHOXAZOLE-TRIMETHOPRIM 800-160 MG PO TABS: 1.0000 | ORAL_TABLET | Freq: Two times a day (BID) | ORAL | 0 refills | Status: AC

## 2020-07-21 NOTE — Progress Notes (Signed)
Chief Complaint  Patient presents with  . Flank Pain    left side pain since friday    Ramonte Mena is a 19 y.o. male here for flank pain and blood in his urine.  Duration: 4 days. No inj or change in acitivty Symptoms: Dysuria, hematuria this AM, urgency, hesitancy, L flank pain Denies: urinary frequency, urinary retention, chills and penile discharge Hx of recurrent UTI? No  No hx of renal infections or stone.   Past Medical History:  Diagnosis Date  . ADHD   . Anxiety   . Compressed spine fracture (HCC) 2015   L6  . Depression      BP 120/86 (BP Location: Right Arm, Patient Position: Sitting, Cuff Size: Normal)   Pulse (!) 103   Temp 98.2 F (36.8 C) (Oral)   Ht 5' 9.5" (1.765 m)   Wt 215 lb (97.5 kg)   SpO2 100%   BMI 31.29 kg/m  General: Awake, alert, appears stated age Heart: RRR Lungs: CTAB, normal respiratory effort, no accessory muscle usage Abd: BS+, soft, LLQ TTP, ND, no masses or organomegaly MSK: + L CVA tenderness, neg Lloyd's sign Psych: Age appropriate judgment and insight  Flank pain - Plan: DG Abd 2 Views, POCT Urinalysis Dipstick (Automated), sulfamethoxazole-trimethoprim (BACTRIM DS) 800-160 MG tablet, HYDROcodone-acetaminophen (NORCO/VICODIN) 5-325 MG tablet  Hematuria, unspecified type - Plan: DG Abd 2 Views, POCT Urinalysis Dipstick (Automated), sulfamethoxazole-trimethoprim (BACTRIM DS) 800-160 MG tablet  I don't see any evidence of a stone, will tx for renal infection given blood and pain with urination.  Stay hydrated. Seek immediate care if pt starts to develop fevers, new/worsening symptoms, uncontrollable N/V. F/u prn. The patient voiced understanding and agreement to the plan.  Jilda Roche Galt, DO 07/21/20 12:09 PM

## 2020-07-21 NOTE — Patient Instructions (Signed)
Ice/cold pack over area for 10-15 min twice daily.  Heat (pad or rice pillow in microwave) over affected area, 10-15 minutes twice daily.   We will treat you for a kidney stone or infection depending on your results.  Ibuprofen 400-600 mg (2-3 over the counter strength tabs) every 6 hours as needed for pain.  Do not drink alcohol, do any illicit/street drugs, drive or do anything that requires alertness while on this stronger pain medicine we send in.   Let us know if you need anything.

## 2020-09-30 ENCOUNTER — Other Ambulatory Visit: Payer: Self-pay

## 2020-09-30 ENCOUNTER — Telehealth (INDEPENDENT_AMBULATORY_CARE_PROVIDER_SITE_OTHER): Payer: No Typology Code available for payment source | Admitting: Psychiatry

## 2020-09-30 DIAGNOSIS — F902 Attention-deficit hyperactivity disorder, combined type: Secondary | ICD-10-CM | POA: Diagnosis not present

## 2020-09-30 DIAGNOSIS — F3181 Bipolar II disorder: Secondary | ICD-10-CM

## 2020-09-30 MED ORDER — LURASIDONE HCL 20 MG PO TABS
20.0000 mg | ORAL_TABLET | Freq: Every day | ORAL | 1 refills | Status: DC
Start: 2020-09-30 — End: 2020-11-11

## 2020-09-30 NOTE — Progress Notes (Signed)
BH MD/PA/NP OP Progress Note  09/30/2020 3:19 PM Shane [name]  MRN:  [id number] Interview was conducted by phone and I verified that I was speaking with the correct person using two identifiers. I discussed the limitations of evaluation and management by telemedicine and  the availability of in person appointments. Patient expressed understanding and agreed to proceed. Participants in the visit: patient (location - home); physician (location - home office).  Chief Complaint: More depressed, irritable, continued weight gain.  HPI: 20yo single white malewitha hxof mood swings, irritability, anger then some depression/anxiety consistent with bipolar spectrum disorder. He also has been diagnosed with ADHD and was in the past on Vyvanse (suppressed appetite) and is doing well on Strattera. He has a hx of cannabis abuse but has not been using any drugs in over 4 years. He stopped taking his meds "for a while"and this causedreemergence ofproblems with mood being more irritable, more angry outbursts,impulsiveand had excessive spending.In mid June2020he resumed taking medsconsistently with parent supervising, and his moodstarted to improve. Hewas obsessing about his health and we increased Prozac further to 60 mg. While obsessive tendencies subsided his mood became unstable - more irritability, renewed problems with compliance. After we decreased Prozac to 40 mg and increased Lamictal to 200 mg his mood normalized. His meds includeLamictal200mg  qam,Abilify10mg  qam, fluoxetine 40 mg andStrattera 80mg  qam.  He has a tendency to obsess/ruminate about his health - hypochondriasis not a major issue at this time.Parents are supportive and he can continue to live at home as long as he follows rules including taking his meds.He works at . He has gained weight - now at 217 lbs (5 ft 10 inches tall). He also reports feeling more depressed, irritable (occasional anger outbursts). Sleep  is normal. He reports being compliant with medications.    Visit Diagnosis:    ICD-10-CM   1. Bipolar 2 disorder (HCC)  F31.81   2. Attention deficit hyperactivity disorder (ADHD), combined type  F90.2     Past Psychiatric History: Please see intake H&P.  Past Medical History:  Past Medical History:  Diagnosis Date  . ADHD   . Anxiety   . Compressed spine fracture (HCC) 2015   L6  . Depression     Past Surgical History:  Procedure Laterality Date  . NO PAST SURGERIES      Family Psychiatric History: Reviewed.  Family History:  Family History  Problem Relation Age of Onset  . Thyroid disease Mother   . Anxiety disorder Mother   . Depression Mother   . Hypertension Father   . Hyperlipidemia Father   . Hypertension Paternal Grandmother   . Hyperlipidemia Paternal Grandfather     Social History:  Social History   Socioeconomic History  . Marital status: Single    Spouse name: Not on file  . Number of children: Not on file  . Years of education: Not on file  . Highest education level: Not on file  Occupational History  . Not on file  Tobacco Use  . Smoking status: Never Smoker  . Smokeless tobacco: Never Used  Vaping Use  . Vaping Use: Never used  Substance and Sexual Activity  . Alcohol use: No  . Drug use: No  . Sexual activity: Never  Other Topics Concern  . Not on file  Social History Narrative  . Not on file   Social Determinants of Health   Financial Resource Strain: Not on file  Food Insecurity: Not on file  Transportation Needs: Not on  file  Physical Activity: Not on file  Stress: Not on file  Social Connections: Not on file    Allergies:  Allergies  Allergen Reactions  . Amoxicillin   . Cantaloupe (Diagnostic)     Metabolic Disorder Labs: Lab Results  Component Value Date   HGBA1C 5.4 04/01/2020   MPG 97 05/02/2017   No results found for: PROLACTIN Lab Results  Component Value Date   CHOL 208 (H) 04/01/2020   TRIG 51.0  04/01/2020   HDL 40.90 04/01/2020   CHOLHDL 5 04/01/2020   VLDL 10.2 04/01/2020   LDLCALC 156 (H) 04/01/2020   Lab Results  Component Value Date   TSH 2.44 09/20/2018    Therapeutic Level Labs: No results found for: LITHIUM No results found for: VALPROATE No components found for:  CBMZ  Current Medications: Current Outpatient Medications  Medication Sig Dispense Refill  . lurasidone (LATUDA) 20 MG TABS tablet Take 1 tablet (20 mg total) by mouth daily with supper. 30 tablet 1  . atomoxetine (STRATTERA) 80 MG capsule TAKE 1 CAPSULE BY MOUTH EVERY DAY 90 capsule 1  . FLUoxetine (PROZAC) 40 MG capsule TAKE 1 CAPSULE BY MOUTH EVERY DAY 90 capsule 1  . fluticasone (FLONASE) 50 MCG/ACT nasal spray Place 1 spray into both nostrils daily. 16 g 0  . HYDROcodone-acetaminophen (NORCO/VICODIN) 5-325 MG tablet Take 1 tablet by mouth every 6 (six) hours as needed for moderate pain. 12 tablet 0  . ibuprofen (ADVIL,MOTRIN) 800 MG tablet Take 1 tablet (800 mg total) by mouth 3 (three) times daily. 21 tablet 0  . lamoTRIgine (LAMICTAL) 200 MG tablet Take 1 tablet (200 mg total) by mouth at bedtime. 90 tablet 0  . loratadine (CLARITIN) 10 MG tablet Take 10 mg by mouth daily.    . meloxicam (MOBIC) 15 MG tablet Take 1 tablet (15 mg total) by mouth daily. 30 tablet 0   No current facility-administered medications for this visit.     Psychiatric Specialty Exam: Review of Systems  Psychiatric/Behavioral: Positive for dysphoric mood.  All other systems reviewed and are negative.   There were no vitals taken for this visit.There is no height or weight on file to calculate BMI.  General Appearance: NA  Eye Contact:  NA  Speech:  Clear and Coherent and Normal Rate  Volume:  Normal  Mood:  Depressed and Irritable  Affect:  NA  Thought Process:  Goal Directed  Orientation:  Full (Time, Place, and Person)  Thought Content: Logical   Suicidal Thoughts:  No  Homicidal Thoughts:  No  Memory:   Immediate;   Good Recent;   Good Remote;   Good  Judgement:  Good  Insight:  Good  Psychomotor Activity:  NA  Concentration:  Concentration: Good  Recall:  Good  Fund of Knowledge: Good  Language: Good  Akathisia:  Negative  Handed:  Right  AIMS (if indicated): not done  Assets:  Communication Skills Desire for Improvement Financial Resources/Insurance Housing Social Support Talents/Skills  ADL's:  Intact  Cognition: WNL  Sleep:  Good   Screenings: GAD-7   Flowsheet Row Counselor from 01/09/2017 in BEHAVIORAL HEALTH OUTPATIENT THERAPY [place]  Total GAD-7 Score 11    PHQ2-9   Flowsheet Row Counselor from 01/09/2017 in BEHAVIORAL HEALTH OUTPATIENT THERAPY [place]  PHQ-2 Total Score 1  PHQ-9 Total Score 6       Assessment and Plan: 20yo single white malewitha hxof mood swings, irritability, anger then some depression/anxiety consistent with bipolar spectrum disorder. He also  has been diagnosed with ADHD and was in the past on Vyvanse (suppressed appetite) and is doing well on Strattera. He has a hx of cannabis abuse but has not been using any drugs in over 4 years. He stopped taking his meds "for a while"and this causedreemergence ofproblems with mood being more irritable, more angry outbursts,impulsiveand had excessive spending.In mid June2020he resumed taking medsconsistently with parent supervising, and his moodstarted to improve. Hewas obsessing about his health and we increased Prozac further to 60 mg. While obsessive tendencies subsided his mood became unstable - more irritability, renewed problems with compliance. After we decreased Prozac to 40 mg and increased Lamictal to 200 mg his mood normalized. His meds includeLamictal200mg  qam,Abilify10mg  qam, fluoxetine 40 mg andStrattera 80mg  qam.  He has a tendency to obsess/ruminate about his health - hypochondriasis not a major issue at this time.Parents are supportive and he can continue to live at  home as long as he follows rules including taking his meds.He works at . He has gained weight - now at 217 lbs (5 ft 10 inches tall). He also reports feeling more depressed, irritable (occasional anger outbursts). He has had passive suicidal thoughts some time ago (none at this time) with no plan or intent. Sleep is normal. He reports being compliant with medications.   Dx: Bipolar 2 disorder depressed; ADHD.  Plan:We will dc aripiprazole and instead add lurasidone 20 mg  q PM for mood. ContinueProzac40 mg,Lamictalto 200 mg at HS andStrattera 80mg  q am.Next appointment inone month at which time we may increase lurasidone and consider tapering off fluoxetine if mood does not significantly improve.The plan was discussed with patientwho had an opportunity to ask questions and these were all answered. I spend33minutes inphone consultationwith the patient.   , MD 09/30/2020, 3:19 PM

## 2020-10-29 ENCOUNTER — Other Ambulatory Visit (HOSPITAL_COMMUNITY): Payer: Self-pay | Admitting: Psychiatry

## 2020-11-11 ENCOUNTER — Other Ambulatory Visit: Payer: Self-pay

## 2020-11-11 ENCOUNTER — Telehealth (INDEPENDENT_AMBULATORY_CARE_PROVIDER_SITE_OTHER): Payer: No Typology Code available for payment source | Admitting: Psychiatry

## 2020-11-11 DIAGNOSIS — F3181 Bipolar II disorder: Secondary | ICD-10-CM

## 2020-11-11 DIAGNOSIS — F908 Attention-deficit hyperactivity disorder, other type: Secondary | ICD-10-CM

## 2020-11-11 MED ORDER — LAMOTRIGINE 200 MG PO TABS: 200.0000 mg | ORAL_TABLET | Freq: Every day | ORAL | 0 refills | Status: AC

## 2020-11-11 MED ORDER — FLUOXETINE HCL 40 MG PO CAPS: ORAL_CAPSULE | ORAL | 1 refills | Status: AC

## 2020-11-11 MED ORDER — LURASIDONE HCL 20 MG PO TABS
20.0000 mg | ORAL_TABLET | Freq: Every day | ORAL | 5 refills | Status: DC
Start: 2020-11-11 — End: 2023-05-15

## 2020-11-11 MED ORDER — ATOMOXETINE HCL 80 MG PO CAPS: ORAL_CAPSULE | ORAL | 1 refills | Status: AC

## 2020-11-11 NOTE — Progress Notes (Signed)
BH MD/PA/NP OP Progress Note  11/11/2020 3:41 PM Shane [name]  MRN:  [id number] Interview was conducted by phone and I verified that I was speaking with the correct person using two identifiers. I discussed the limitations of evaluation and management by telemedicine and  the availability of in person appointments. Patient expressed understanding and agreed to proceed. Participants in the visit: patient (location - home); physician (location - home office).  Chief Complaint: "I am much better".  HPI: 20yo single white malewitha hxof mood swings, irritability, anger then some depression/anxiety consistent with bipolar spectrum disorder. He also has been diagnosed with ADHD and was in the past on Vyvanse (suppressed appetite) and is doing well on Strattera. He has a hx of cannabis abuse but has not been using any drugs in over 4 years. He stopped taking his meds "for a while"and this causedreemergence ofproblems with mood being more irritable, more angry outbursts,impulsiveand had excessive spending.In mid June2020he resumed taking medsconsistently with parent supervising, and his moodstarted to improve. Hewas obsessing about his health and we increased Prozac further to 60 mg. While obsessive tendencies subsided his mood became unstable - more irritability, renewed problems with compliance. After we decreased Prozac to 40 mg and increased Lamictal to 200 mg his mood normalized. His meds includeLamictal200mg  qam,Abilify10mg  qam, fluoxetine 40 mg andStrattera 80mg  qam.  He has a tendency to obsess/ruminate about his health - hypochondriasis not a major issue at this time.Parents are supportive and he can continue to live at home as long as he follows rules including taking his meds.He works at . He has gained weight - now at 217 lbs (5 ft 10 inches tall). He also reports feeling more depressed, irritable (occasional anger outbursts). Sleep is normal. He reports being  compliant with medications. We have dc aripiprazole and started lurasidone 20 mg. He reports that his mood improved (no longer depressed or irritable), appetite normalized (lost some weight already).   Visit Diagnosis:    ICD-10-CM   1. Bipolar 2 disorder (HCC)  F31.81   2. Attention deficit hyperactivity disorder (ADHD), other type  F90.8     Past Psychiatric History: Please see intake H&P.  Past Medical History:  Past Medical History:  Diagnosis Date  . ADHD   . Anxiety   . Compressed spine fracture (HCC) 2015   L6  . Depression     Past Surgical History:  Procedure Laterality Date  . NO PAST SURGERIES      Family Psychiatric History: Reviewed.  Family History:  Family History  Problem Relation Age of Onset  . Thyroid disease Mother   . Anxiety disorder Mother   . Depression Mother   . Hypertension Father   . Hyperlipidemia Father   . Hypertension Paternal Grandmother   . Hyperlipidemia Paternal Grandfather     Social History:  Social History   Socioeconomic History  . Marital status: Single    Spouse name: Not on file  . Number of children: Not on file  . Years of education: Not on file  . Highest education level: Not on file  Occupational History  . Not on file  Tobacco Use  . Smoking status: Never Smoker  . Smokeless tobacco: Never Used  Vaping Use  . Vaping Use: Never used  Substance and Sexual Activity  . Alcohol use: No  . Drug use: No  . Sexual activity: Never  Other Topics Concern  . Not on file  Social History Narrative  . Not on file  Social Determinants of Health   Financial Resource Strain: Not on file  Food Insecurity: Not on file  Transportation Needs: Not on file  Physical Activity: Not on file  Stress: Not on file  Social Connections: Not on file    Allergies:  Allergies  Allergen Reactions  . Amoxicillin   . Cantaloupe (Diagnostic)     Metabolic Disorder Labs: Lab Results  Component Value Date   HGBA1C 5.4  04/01/2020   MPG 97 05/02/2017   No results found for: PROLACTIN Lab Results  Component Value Date   CHOL 208 (H) 04/01/2020   TRIG 51.0 04/01/2020   HDL 40.90 04/01/2020   CHOLHDL 5 04/01/2020   VLDL 10.2 04/01/2020   LDLCALC 156 (H) 04/01/2020   Lab Results  Component Value Date   TSH 2.44 09/20/2018    Therapeutic Level Labs: No results found for: LITHIUM No results found for: VALPROATE No components found for:  CBMZ  Current Medications: Current Outpatient Medications  Medication Sig Dispense Refill  . atomoxetine (STRATTERA) 80 MG capsule TAKE 1 CAPSULE BY MOUTH EVERY DAY 90 capsule 1  . FLUoxetine (PROZAC) 40 MG capsule TAKE 1 CAPSULE BY MOUTH EVERY DAY 90 capsule 1  . fluticasone (FLONASE) 50 MCG/ACT nasal spray Place 1 spray into both nostrils daily. 16 g 0  . HYDROcodone-acetaminophen (NORCO/VICODIN) 5-325 MG tablet Take 1 tablet by mouth every 6 (six) hours as needed for moderate pain. 12 tablet 0  . ibuprofen (ADVIL,MOTRIN) 800 MG tablet Take 1 tablet (800 mg total) by mouth 3 (three) times daily. 21 tablet 0  . [START ON 01/21/2021] lamoTRIgine (LAMICTAL) 200 MG tablet Take 1 tablet (200 mg total) by mouth at bedtime. 90 tablet 0  . loratadine (CLARITIN) 10 MG tablet Take 10 mg by mouth daily.    Marland Kitchen lurasidone (LATUDA) 20 MG TABS tablet Take 1 tablet (20 mg total) by mouth daily with supper. 30 tablet 5  . meloxicam (MOBIC) 15 MG tablet Take 1 tablet (15 mg total) by mouth daily. 30 tablet 0   No current facility-administered medications for this visit.      Psychiatric Specialty Exam: Review of Systems  All other systems reviewed and are negative.   There were no vitals taken for this visit.There is no height or weight on file to calculate BMI.  General Appearance: NA  Eye Contact:  NA  Speech:  Clear and Coherent and Normal Rate  Volume:  Normal  Mood:  Euthymic  Affect:  NA  Thought Process:  Goal Directed and Linear  Orientation:  Full (Time, Place,  and Person)  Thought Content: Logical   Suicidal Thoughts:  No  Homicidal Thoughts:  No  Memory:  Immediate;   Good Recent;   Good Remote;   Good  Judgement:  Good  Insight:  Good  Psychomotor Activity:  NA  Concentration:  Concentration: Good  Recall:  Good  Fund of Knowledge: Good  Language: Good  Akathisia:  Negative  Handed:  Right  AIMS (if indicated): not done  Assets:  Communication Skills Desire for Improvement Financial Resources/Insurance Housing Social Support  ADL's:  Intact  Cognition: WNL  Sleep:  Good   Screenings: GAD-7   Flowsheet Row Counselor from 01/09/2017 in [facility] [place]  Total GAD-7 Score 11    PHQ2-9   Flowsheet Row Counselor from 01/09/2017 in BEH[facility]IORAL HEALTH OUTPAT[facility] THERAPY   PHQ-2 Total Score 1  PHQ-9 Total Score 6       Assessment and  Plan: 20yo single white malewitha hxof mood swings, irritability, anger then some depression/anxiety consistent with bipolar spectrum disorder. He also has been diagnosed with ADHD and was in the past on Vyvanse (suppressed appetite) and is doing well on Strattera. He has a hx of cannabis abuse but has not been using any drugs in over 4 years. He stopped taking his meds "for a while"and this causedreemergence ofproblems with mood being more irritable, more angry outbursts,impulsiveand had excessive spending.In mid June2020he resumed taking medsconsistently with parent supervising, and his moodstarted to improve. Hewas obsessing about his health and we increased Prozac further to 60 mg. While obsessive tendencies subsided his mood became unstable - more irritability, renewed problems with compliance. After we decreased Prozac to 40 mg and increased Lamictal to 200 mg his mood normalized. His meds includeLamictal200mg  qam,Abilify10mg  qam, fluoxetine 40 mg andStrattera 80mg  qam.  He has a tendency to obsess/ruminate about his health - hypochondriasis  not a major issue at this time.Parents are supportive and he can continue to live at home as long as he follows rules including taking his meds.He works at . He has gained weight - now at 217 lbs (5 ft 10 inches tall). He also reports feeling more depressed, irritable (occasional anger outbursts). Sleep is normal. He reports being compliant with medications. We have dc aripiprazole and started lurasidone 20 mg. He reports that his mood improved (no longer depressed or irritable), appetite normalized (lost some weight already). N0 EPS or akathisia reported.  Dx: Bipolar 2 disorder depressed; ADHD.  Plan:We will continue lurasidone 20 mg  q PM for mood, Prozac40 mg,Lamictalto 200 mg at HS andStrattera 80mg  q am.Next appointment inthree months with a new provider.The plan was discussed with patientwho had an opportunity to ask questions and these were all answered. I spend45minutes inphone consultationwith thepatient.    , MD 11/11/2020, 3:41 PM

## 2021-03-15 ENCOUNTER — Telehealth (HOSPITAL_COMMUNITY): Payer: Self-pay

## 2021-03-15 NOTE — Telephone Encounter (Signed)
CERTIFIED LETTER AND REFERRAL LIST SENT TO PATIENT

## 2021-08-03 ENCOUNTER — Encounter: Payer: Self-pay | Admitting: Family Medicine

## 2021-08-03 ENCOUNTER — Ambulatory Visit: Payer: No Typology Code available for payment source | Admitting: Family Medicine

## 2021-08-03 ENCOUNTER — Other Ambulatory Visit: Payer: Self-pay

## 2021-08-03 VITALS — BP 122/80 | HR 113 | Temp 99.8°F | Ht 69.0 in | Wt 198.0 lb

## 2021-08-03 DIAGNOSIS — R6889 Other general symptoms and signs: Secondary | ICD-10-CM | POA: Diagnosis not present

## 2021-08-03 LAB — POCT INFLUENZA A/B
Influenza A, POC: NEGATIVE
Influenza B, POC: NEGATIVE

## 2021-08-03 LAB — POCT RAPID STREP A (OFFICE): Rapid Strep A Screen: NEGATIVE

## 2021-08-03 MED ORDER — OSELTAMIVIR PHOSPHATE 75 MG PO CAPS: 75.0000 mg | ORAL_CAPSULE | Freq: Two times a day (BID) | ORAL | 0 refills | Status: AC

## 2021-08-03 NOTE — Patient Instructions (Addendum)
Continue to push fluids, practice good hand hygiene, and cover your mouth if you cough.  If you start having fevers, shaking or shortness of breath, seek immediate care.  Consider throat lozenges, salt water gargles and an air humidifier for symptomatic care.   OK to take Tylenol 1000 mg (2 extra strength tabs) or 975 mg (3 regular strength tabs) every 6 hours as needed.  Ibuprofen 400-600 mg (2-3 over the counter strength tabs) every 6 hours as needed for pain.  Let us know if you need anything.

## 2021-08-03 NOTE — Addendum Note (Signed)
Addended by: Scharlene Gloss B on: 08/03/2021 10:28 AM   Modules accepted: Orders

## 2021-08-03 NOTE — Progress Notes (Signed)
Chief Complaint  Patient presents with   Sore Throat    Tested Negative for Covid   Cough    [name] here for URI complaints.  Duration: 2 days  Associated symptoms: Fever (101 F), sinus pain, rhinorrhea, sore throat, and coughing Denies: sinus congestion, itchy watery eyes, ear pain, ear drainage, wheezing, shortness of breath, myalgia, and N/V/D Treatment to date: Dayquil Sick contacts: Yes; flu going around at work Tested neg for covid.   Past Medical History:  Diagnosis Date   ADHD    Anxiety    Compressed spine fracture (HCC) 2015   L6   Depression     Objective BP 122/80   Pulse (!) 113   Temp 99.8 F (37.7 C) (Oral)   Ht 5\' 9"  (1.753 m)   Wt 198 lb (89.8 kg)   SpO2 99%   BMI 29.24 kg/m  General: Awake, alert, appears stated age HEENT: AT, , ears patent b/l and TM's neg, nares patent w/o discharge, pharynx pink and without exudates, MMM Neck: No masses or asymmetry, +tender adenopathy b/l.  Heart: Reg rhythm, tachycardic Lungs: CTAB, no accessory muscle use Psych: Age appropriate judgment and insight, normal mood and affect  Flu-like symptoms - Plan: oseltamivir (TAMIFLU) 75 MG capsule  Will tx, rapid strep neg w 2/4 Centor.  Continue to push fluids, practice good hand hygiene, cover mouth when coughing. F/u prn. If starting to experience irreplaceable fluid loss, shaking, or shortness of breath, seek immediate care. Pt voiced understanding and agreement to the plan.  [place], DO 08/03/21 10:14 AM

## 2021-08-04 ENCOUNTER — Ambulatory Visit: Payer: Self-pay | Admitting: Family Medicine

## 2021-08-11 DIAGNOSIS — F603 Borderline personality disorder: Secondary | ICD-10-CM | POA: Insufficient documentation

## 2022-02-02 ENCOUNTER — Encounter: Payer: Self-pay | Admitting: Family Medicine

## 2022-02-02 ENCOUNTER — Ambulatory Visit (INDEPENDENT_AMBULATORY_CARE_PROVIDER_SITE_OTHER): Payer: No Typology Code available for payment source | Admitting: Family Medicine

## 2022-02-02 VITALS — BP 122/82 | HR 102 | Temp 98.0°F | Ht 68.0 in | Wt 207.0 lb

## 2022-02-02 DIAGNOSIS — R635 Abnormal weight gain: Secondary | ICD-10-CM

## 2022-02-02 DIAGNOSIS — Z789 Other specified health status: Secondary | ICD-10-CM

## 2022-02-02 NOTE — Progress Notes (Signed)
Chief Complaint  ?Patient presents with  ? Weight Gain  ? Abdominal Pain  ?  Diarrhea ?Has been making poor food choices ?Has been drinking alcohol  ? ? ?Subjective: ?Patient is a 21 y.o. male here for weight gain. ? ?Over the past couple months, the patient has started to drink more alcohol since starting 21 and eating more fast food.  There was a period of time where he would eat all 3 meals of the day from fast food restaurants.  He started to gain weight, around 10 pounds since his last visit.  He has also started to run more and lift weights with his friends.  He is concerned that this could affect his health long-term.  He is not having any chest pain or shortness of breath.  Last week, he drank to the point of passing out.  He was drinking a combination of beer and liquor.  He has had associated abdominal pain, frequent bowel movements, and occasional nausea.  He has not had any bleeding, unintentional weight loss, nighttime awakenings, or fevers. ? ?Past Medical History:  ?Diagnosis Date  ? ADHD   ? Anxiety   ? Compressed spine fracture (HCC) 2015  ? L6  ? Depression   ? ? ?Objective: ?BP 122/82   Pulse (!) 102   Temp 98 ?F (36.7 ?C) (Oral)   Ht 5\' 8"  (1.727 m)   Wt 207 lb (93.9 kg)   SpO2 99%   BMI 31.47 kg/m?  ?General: Awake, appears stated age ?Heart: RRR, no LE edema ?Lungs: CTAB, no rales, wheezes or rhonchi. No accessory muscle use ?Abdomen: Bowel sounds present, soft, TTP in the epigastric region moderate palpation, nondistended, no masses or organomegaly ?Skin: no jaundice ?Eyes: No icterus ?Psych: Age appropriate judgment and insight, normal affect and mood ? ?Assessment and Plan: ?Weight gain - Plan: Lipid panel, TSH, Comprehensive metabolic panel ? ?Alcohol use ? ?Check above labs.  May be dealing with fatty liver disease which we did discuss.  Counseled on diet and exercise.  I think his very poor diet is contributing to his abdominal issues.  He is on a host of medicines from a  psychiatrist, I would be hesitant to add a TCA at this time.  Monitor alcohol consumption.  Not a great idea to combine with some of the medicines he is on but I did encourage him to drink responsibly.  Follow-up pending the above. ?The patient voiced understanding and agreement to the plan. ? ?I spent 30 minutes with the patient discussing the above plan in addition to reviewing his chart on the same day of the visit. ? ? , DO ?02/02/22  ?4:28 PM ? ? ? ? ?

## 2022-02-02 NOTE — Patient Instructions (Signed)
Keep the diet clean and stay active. ° °Aim to do some physical exertion for 150 minutes per week. This is typically divided into 5 days per week, 30 minutes per day. The activity should be enough to get your heart rate up. Anything is better than nothing if you have time constraints. ° °Let us know if you need anything. ° °Healthy Eating Plan °Many factors influence your heart health, including eating and exercise habits. Heart (coronary) risk increases with abnormal blood fat (lipid) levels. Heart-healthy meal planning includes limiting unhealthy fats, increasing healthy fats, and making other small dietary changes. This includes maintaining a healthy body weight to help keep lipid levels within a normal range. ° °WHAT IS MY PLAN?  °Your health care provider recommends that you: °Drink a glass of water before meals to help with satiety. °Eat slowly. °An alternative to the water is to add Metamucil. This will help with satiety as well. It does contain calories, unlike water. ° °WHAT TYPES OF FAT SHOULD I CHOOSE? °Choose healthy fats more often. Choose monounsaturated and polyunsaturated fats, such as olive oil and canola oil, flaxseeds, walnuts, almonds, and seeds. °Eat more omega-3 fats. Good choices include salmon, mackerel, sardines, tuna, flaxseed oil, and ground flaxseeds. Aim to eat fish at least two times each week. °Avoid foods with partially hydrogenated oils in them. These contain trans fats. Examples of foods that contain trans fats are stick margarine, some tub margarines, cookies, crackers, and other baked goods. If you are going to avoid a fat, this is the one to avoid! ° °WHAT GENERAL GUIDELINES DO I NEED TO FOLLOW? °Check food labels carefully to identify foods with trans fats. Avoid these types of options when possible. °Fill one half of your plate with vegetables and green salads. Eat 4-5 servings of vegetables per day. A serving of vegetables equals 1 cup of raw leafy vegetables, ½ cup of raw or  cooked cut-up vegetables, or ½ cup of vegetable juice. °Fill one fourth of your plate with whole grains. Look for the word "whole" as the first word in the ingredient list. °Fill one fourth of your plate with lean protein foods. °Eat 4-5 servings of fruit per day. A serving of fruit equals one medium whole fruit, ¼ cup of dried fruit, ½ cup of fresh, frozen, or canned fruit. Try to avoid fruits in cups/syrups as the sugar content can be high. °Eat more foods that contain soluble fiber. Examples of foods that contain this type of fiber are apples, broccoli, carrots, beans, peas, and barley. Aim to get 20-30 g of fiber per day. °Eat more home-cooked food and less restaurant, buffet, and fast food. °Limit or avoid alcohol. °Limit foods that are high in starch and sugar. °Avoid fried foods when able. °Cook foods by using methods other than frying. Baking, boiling, grilling, and broiling are all great options. Other fat-reducing suggestions include: °Removing the skin from poultry. °Removing all visible fats from meats. °Skimming the fat off of stews, soups, and gravies before serving them. °Steaming vegetables in water or broth. °Lose weight if you are overweight. Losing just 5-10% of your initial body weight can help your overall health and prevent diseases such as diabetes and heart disease. °Increase your consumption of nuts, legumes, and seeds to 4-5 servings per week. One serving of dried beans or legumes equals ½ cup after being cooked, one serving of nuts equals 1½ ounces, and one serving of seeds equals ½ ounce or 1 tablespoon. ° °WHAT ARE GOOD FOODS   CAN I EAT? °Grains °Grainy breads (try to find bread that is 3 g of fiber per slice or greater), oatmeal, light popcorn. Whole-grain cereals. Rice and pasta, including brown rice and those that are made with whole wheat. Edamame pasta is a great alternative to grain pasta. It has a higher protein content. Try to avoid significant consumption of white bread, sugary  cereals, or pastries/baked goods. ° °Vegetables °All vegetables. Cooked white potatoes do not count as vegetables. ° °Fruits °All fruits, but limit pineapple and bananas as these fruits have a higher sugar content. ° °Meats and Other Protein Sources °Lean, well-trimmed beef, veal, pork, and lamb. Chicken and turkey without skin. All fish and shellfish. Wild duck, rabbit, pheasant, and venison. Egg whites or low-cholesterol egg substitutes. Dried beans, peas, lentils, and tofu. Seeds and most nuts. ° °Dairy °Low-fat or nonfat cheeses, including ricotta, string, and mozzarella. Skim or 1% milk that is liquid, powdered, or evaporated. Buttermilk that is made with low-fat milk. Nonfat or low-fat yogurt. Soy/Almond milk are good alternatives if you cannot handle dairy. ° °Beverages °Water is the best for you. Sports drinks with less sugar are more desirable unless you are a highly active athlete. ° °Sweets and Desserts °Sherbets and fruit ices. Honey, jam, marmalade, jelly, and syrups. Dark chocolate.  °Eat all sweets and desserts in moderation. ° °Fats and Oils °Nonhydrogenated (trans-free) margarines. Vegetable oils, including soybean, sesame, sunflower, olive, peanut, safflower, corn, canola, and cottonseed. Salad dressings or mayonnaise that are made with a vegetable oil. Limit added fats and oils that you use for cooking, baking, salads, and as spreads. ° °Other °Cocoa powder. Coffee and tea. Most condiments. ° °The items listed above may not be a complete list of recommended foods or beverages. Contact your dietitian for more options. °

## 2022-02-03 LAB — TSH: TSH: 2.47 u[IU]/mL (ref 0.35–5.50)

## 2022-02-03 LAB — LIPID PANEL
Cholesterol: 214 mg/dL — ABNORMAL HIGH (ref 0–200)
HDL: 44.9 mg/dL (ref >39.00)
LDL Cholesterol: 151 mg/dL — ABNORMAL HIGH (ref 0–99)
NonHDL: 168.79
Total CHOL/HDL Ratio: 5
Triglycerides: 87 mg/dL (ref 0.0–149.0)
VLDL: 17.4 mg/dL (ref 0.0–40.0)

## 2022-02-03 LAB — COMPREHENSIVE METABOLIC PANEL
ALT: 17 U/L (ref 0–53)
AST: 14 U/L (ref 0–37)
Albumin: 4.7 g/dL (ref 3.5–5.2)
Alkaline Phosphatase: 80 U/L (ref 39–117)
BUN: 14 mg/dL (ref 6–23)
CO2: 31 mEq/L (ref 19–32)
Calcium: 9.8 mg/dL (ref 8.4–10.5)
Chloride: 100 mEq/L (ref 96–112)
Creatinine, Ser: 1.22 mg/dL (ref 0.40–1.50)
GFR: 84.83 mL/min (ref >60.00)
Glucose, Bld: 75 mg/dL (ref 70–99)
Potassium: 4.4 mEq/L (ref 3.5–5.1)
Sodium: 140 mEq/L (ref 135–145)
Total Bilirubin: 0.5 mg/dL (ref 0.2–1.2)
Total Protein: 6.9 g/dL (ref 6.0–8.3)

## 2022-06-12 IMAGING — CT CT HEAD W/O CM
3 series · 16 of 47 positions shown, 19 images · non-contrast
Comparison: CT brain 12/29/2016

CLINICAL DATA: Syncope

EXAM:
CT HEAD WITHOUT CONTRAST
TECHNIQUE: Contiguous axial images were obtained from the base of the skull
through the vertex without intravenous contrast.

[Series 2: head wo · axial · 0.49mm/px · z∈[-173,-38]mm · 10 of 33 slices shown, 13 images]
[im 3/33  brain]
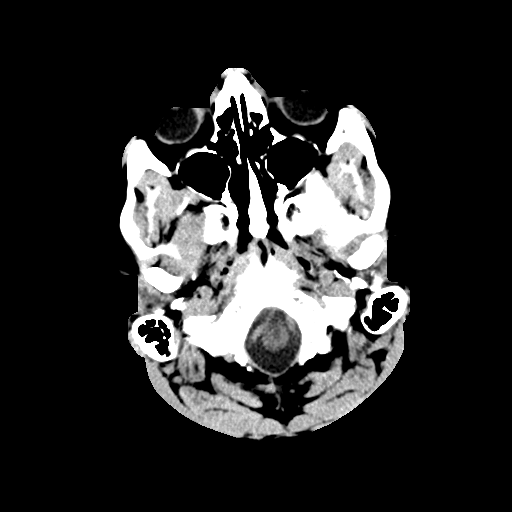
[im 3/33  bone]
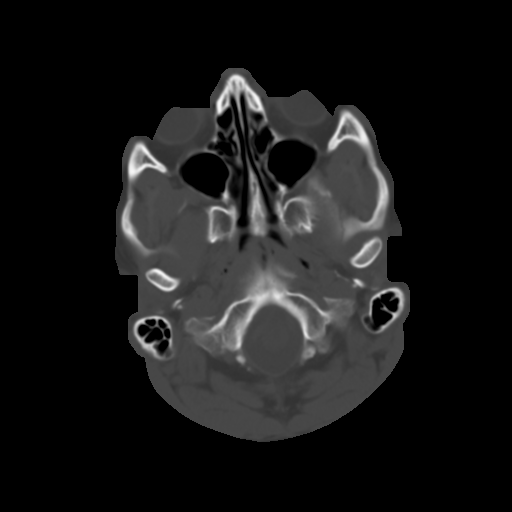
[im 6/33  brain]
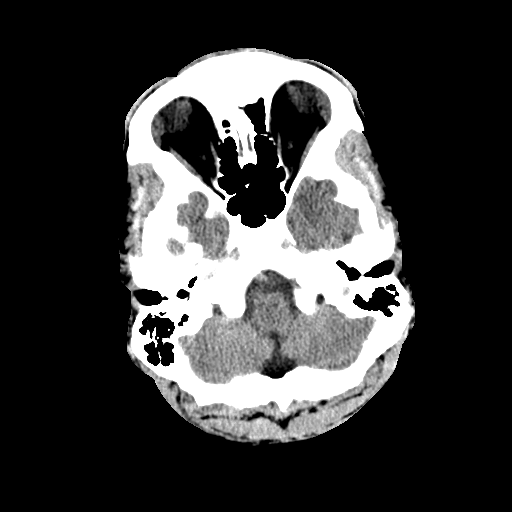
[im 9/33  brain]
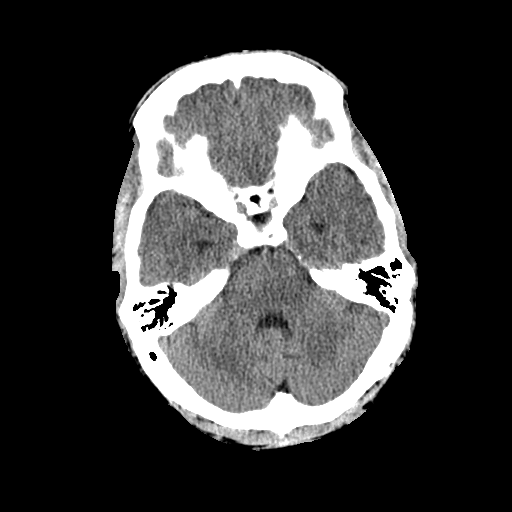
[im 12/33  brain]
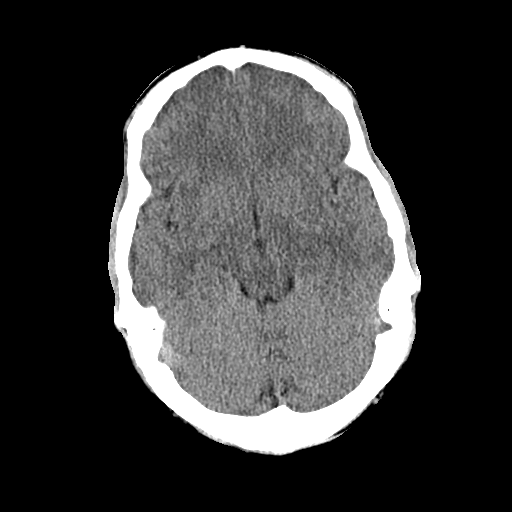
[im 15/33  brain]
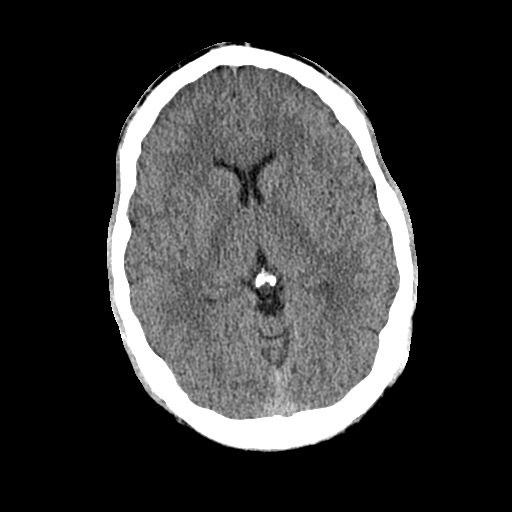
[im 15/33  bone]
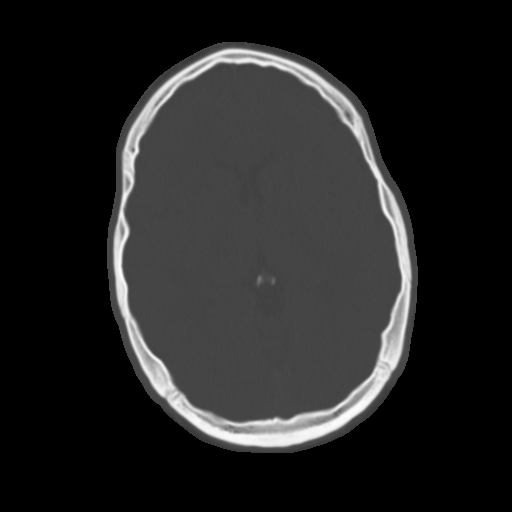
[im 18/33  brain]
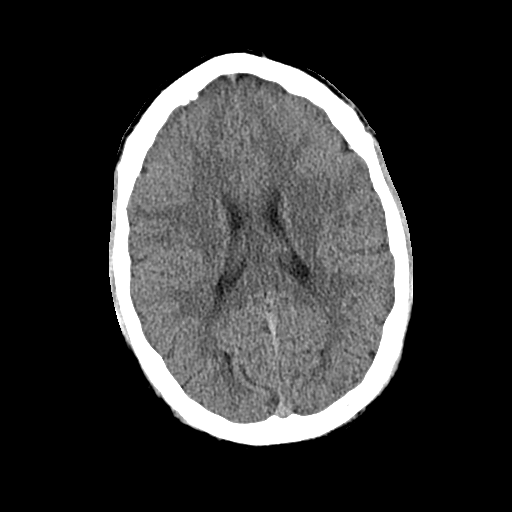
[im 21/33  brain]
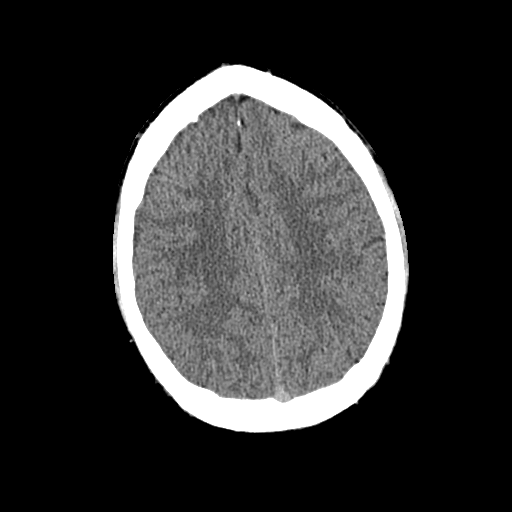
[im 25/33  brain]
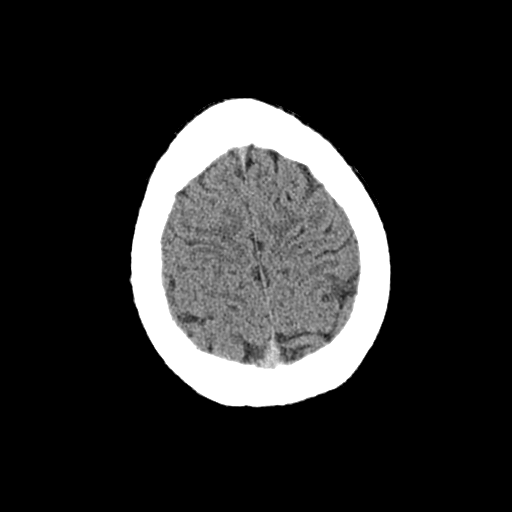
[im 27/33  brain]
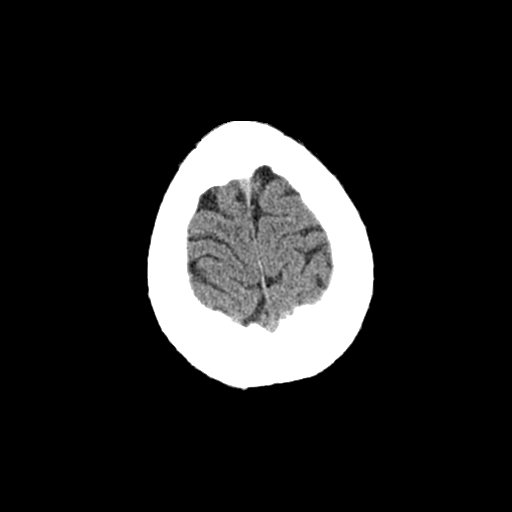
[im 27/33  bone]
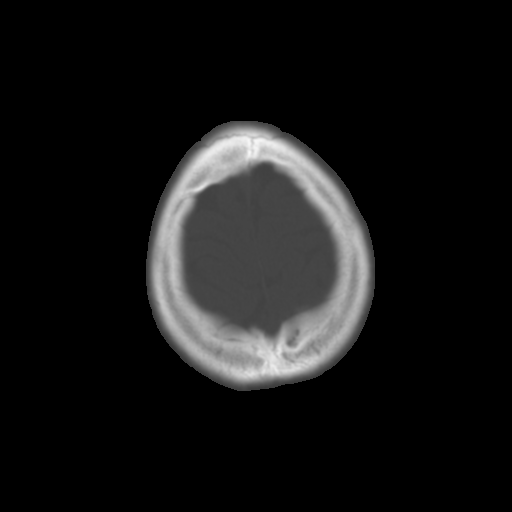
[im 30/33  brain]
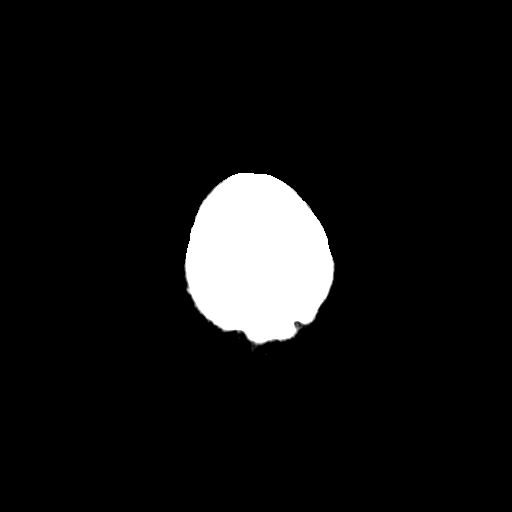

[Series 4: cor soft · coronal · 0.38mm/px · 3 of 84 slices shown]
[im 28/84  brain]
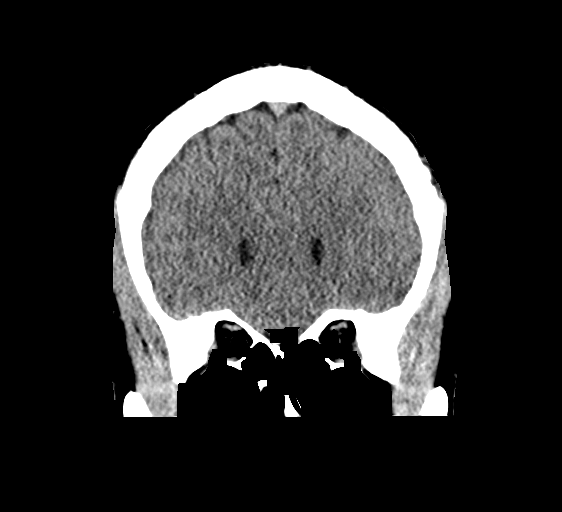
[im 37/84  brain]
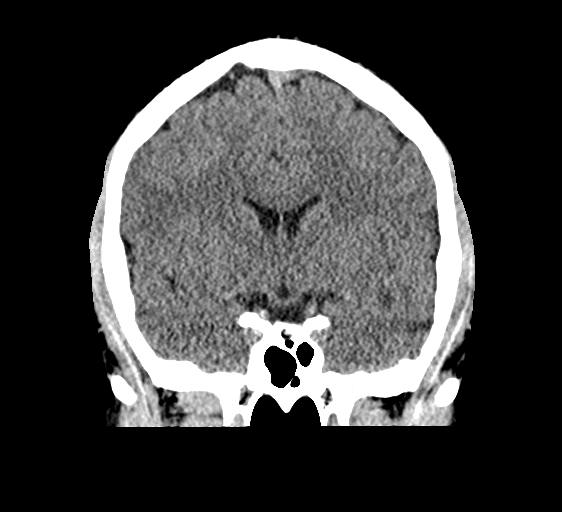
[im 47/84  brain]
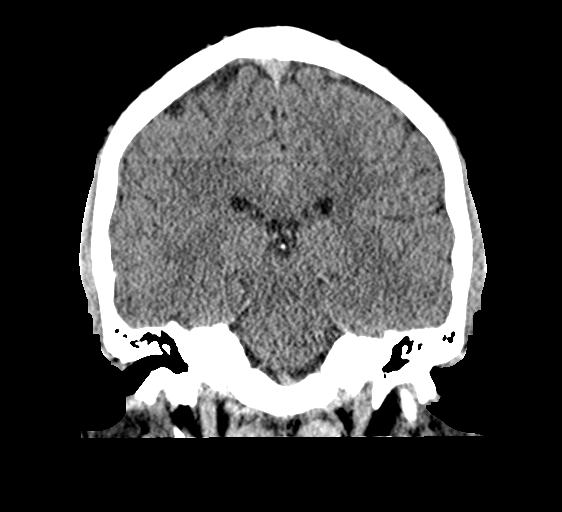

[Series 5: sag soft · sagittal · 0.38mm/px · 3 of 60 slices shown]
[im 20/60  brain]
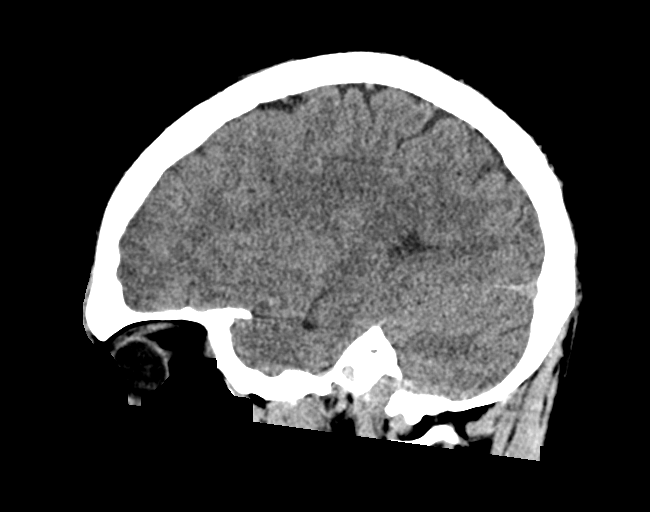
[im 30/60  brain]
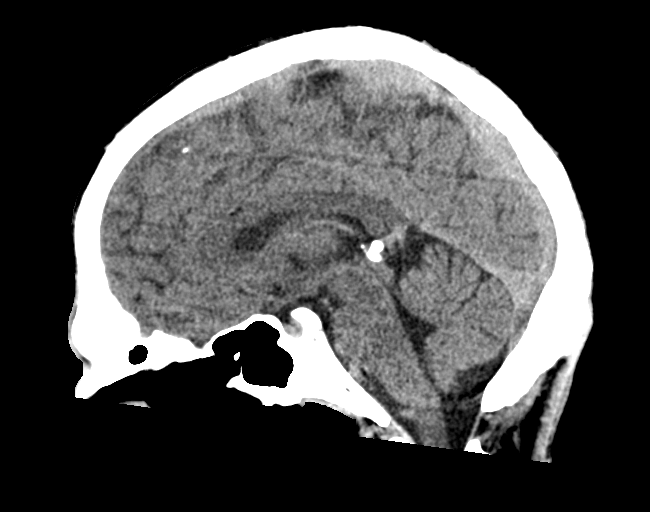
[im 40/60  brain]
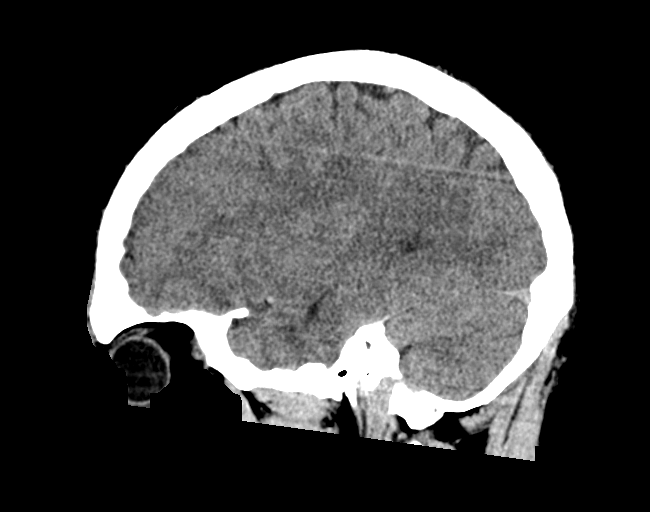

[16 of 47 positions shown; findings below may reference images not displayed]

FINDINGS: Brain: No acute territorial infarction, hemorrhage or intracranial
mass. The ventricles are nonenlarged.

Vascular: No hyperdense vessel or unexpected calcification.

Skull: Normal. Negative for fracture or focal lesion.

Sinuses/Orbits: No acute finding.

Other: None
IMPRESSION: Negative non contrasted CT appearance of the brain.

## 2022-06-12 IMAGING — DX DG THORACIC SPINE 2V
3 series · 3 of 3 positions shown · non-contrast
Comparison: None.

CLINICAL DATA: Syncopal episode, back pain

EXAM:
THORACIC SPINE 2 VIEWS

[t-spine ap]
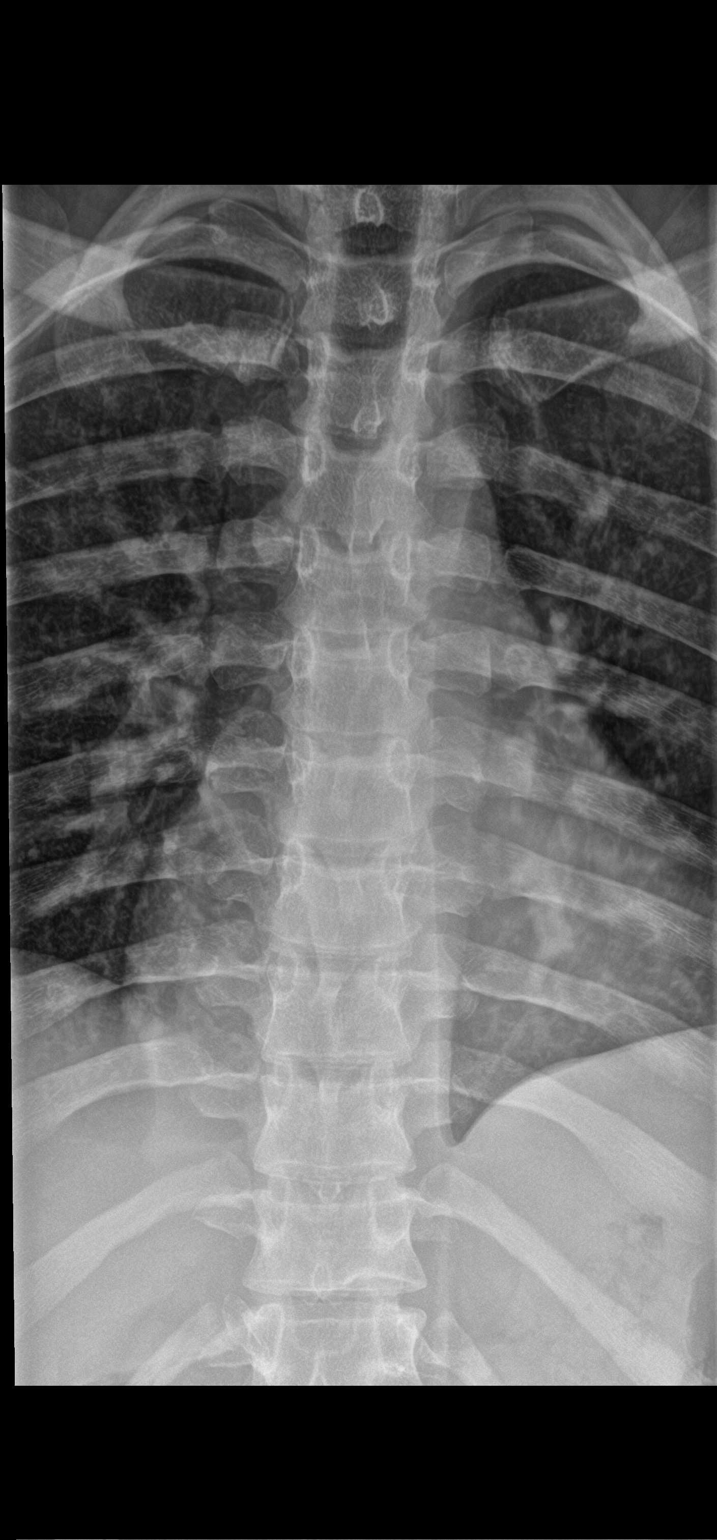

[t-spine lat]
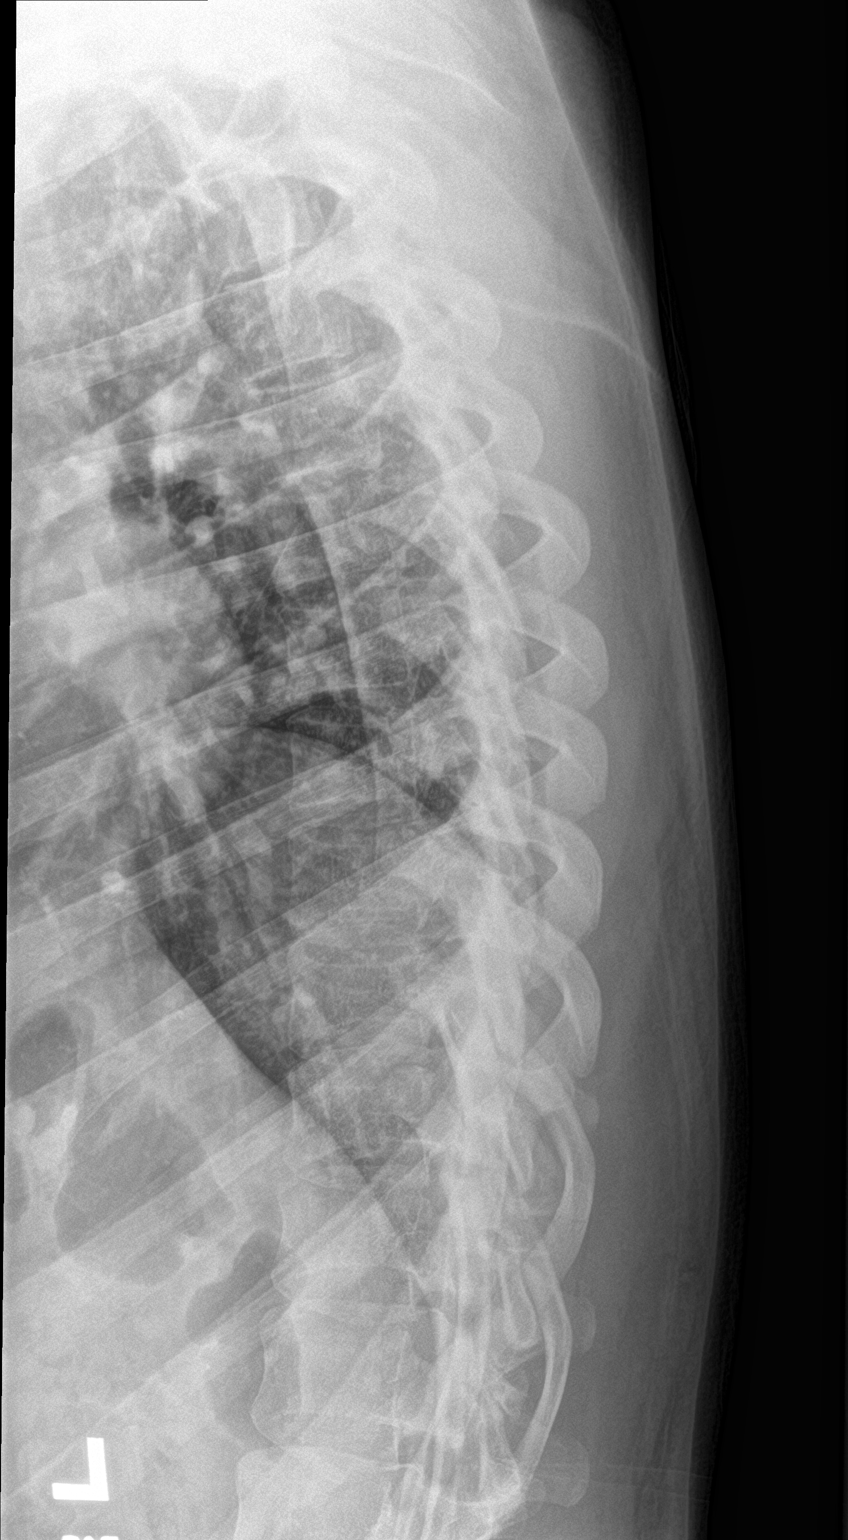

[t-spine swimmers]
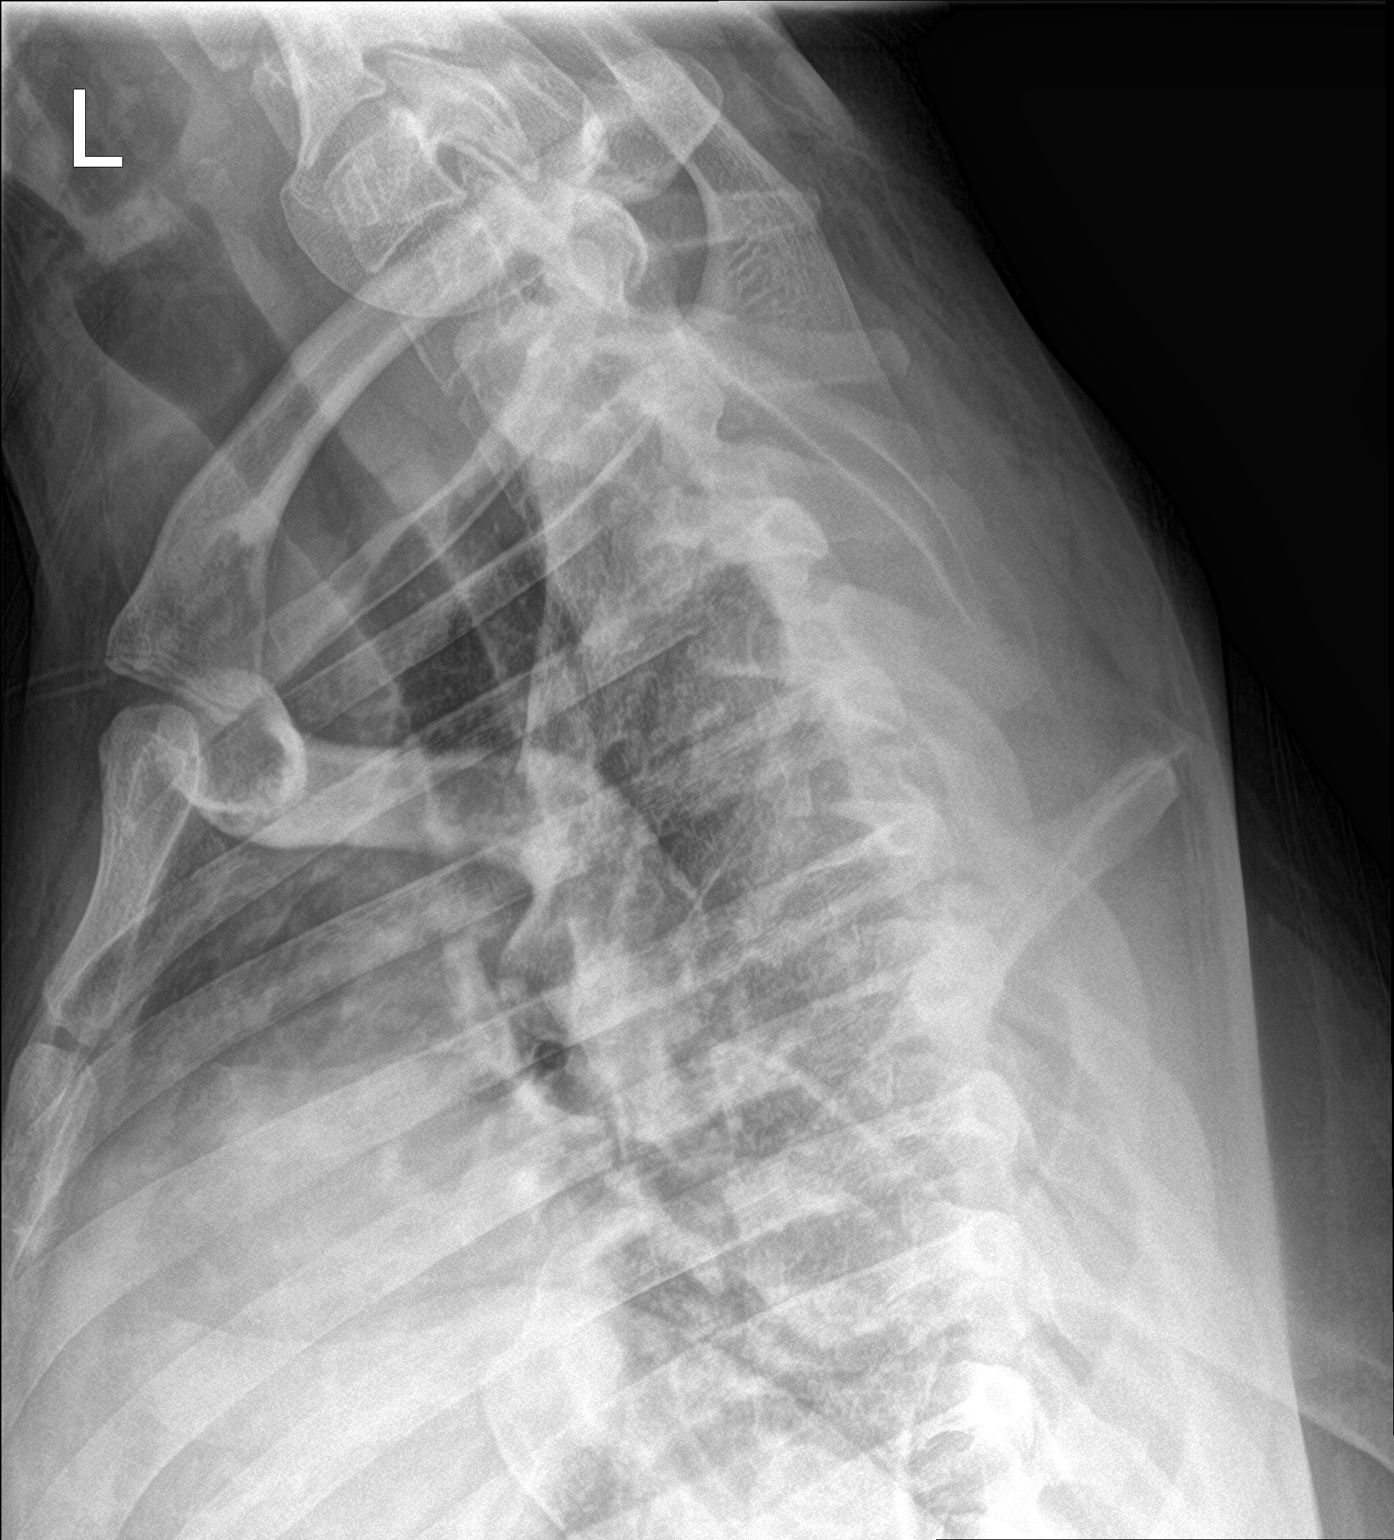

[3 of 3 positions shown; findings below may reference images not displayed]

FINDINGS: There is no evidence of thoracic spine fracture. Alignment is
normal. No other significant bone abnormalities are identified.
IMPRESSION: Negative.

## 2022-08-24 ENCOUNTER — Encounter: Payer: Self-pay | Admitting: Family Medicine

## 2022-08-24 ENCOUNTER — Ambulatory Visit (INDEPENDENT_AMBULATORY_CARE_PROVIDER_SITE_OTHER): Payer: No Typology Code available for payment source | Admitting: Family Medicine

## 2022-08-24 VITALS — BP 138/80 | HR 109 | Temp 98.3°F | Ht 70.0 in | Wt 201.5 lb

## 2022-08-24 DIAGNOSIS — J984 Other disorders of lung: Secondary | ICD-10-CM

## 2022-08-24 MED ORDER — PREDNISONE 20 MG PO TABS: 40.0000 mg | ORAL_TABLET | Freq: Every day | ORAL | 0 refills | Status: AC

## 2022-08-24 MED ORDER — HYOSCYAMINE SULFATE 0.125 MG SL SUBL
0.1250 mg | SUBLINGUAL_TABLET | Freq: Once | SUBLINGUAL | Status: AC
  Administered 2022-08-24: 0.125 mg via SUBLINGUAL

## 2022-08-24 MED ORDER — ALUM & MAG HYDROXIDE-SIMETH 200-200-20 MG/5ML PO SUSP
15.0000 mL | Freq: Once | ORAL | Status: AC
  Administered 2022-08-24: 15 mL via ORAL

## 2022-08-24 MED ORDER — LIDOCAINE VISCOUS HCL 2 % MT SOLN
15.0000 mL | Freq: Once | OROMUCOSAL | Status: AC
  Administered 2022-08-24: 15 mL via OROMUCOSAL

## 2022-08-24 NOTE — Patient Instructions (Signed)
Continue to push fluids, practice good hand hygiene, and cover your mouth if you cough.  If you start having fevers, shaking or shortness of breath, seek immediate care.  Send me a message in 2 days if no better.   Let us know if you need anything. 

## 2022-08-24 NOTE — Progress Notes (Signed)
Chief Complaint  Patient presents with   vaping for one year    Shortness of breathe Fatigue Having back pain from breathing    Subjective: Patient is a 21 y.o. male here for SOB.  Going on for 5 d. Not getting better. He is coughing, having a burning pain in center of chest, fatigue. No sinus pain, wheezing, fevers, ST, runny/stuff nose, itchy/water eyes, ear pain, drainage from ears. Took OTC reflux med that helped a bit. No sick contacts.    Past Medical History:  Diagnosis Date   ADHD    Anxiety    Compressed spine fracture (HCC) 2015   L6   Depression     Objective: BP 138/80 (BP Location: Left Arm, Patient Position: Sitting, Cuff Size: Normal)   Pulse (!) 109   Temp 98.3 F (36.8 C) (Oral)   Ht 5\' 10"  (1.778 m)   Wt 201 lb 8 oz (91.4 kg)   SpO2 98%   BMI 28.91 kg/m  General: Awake, appears stated age Heart: reg rhythm, tachycardic Lungs: CTAB, no rales, wheezes or rhonchi. No accessory muscle use Abd: BS+, S, NT, ND HEENT: Ears neg b/l, nares patent w/o rhinorrhea, no sinus ttp, MMM, no pharyngeal exudate or erythema Psych: Age appropriate judgment and insight, normal affect and mood  Assessment and Plan: Pneumonitis - Plan: predniSONE (DELTASONE) 20 MG tablet  5 d pred burst. GI cocktail given today, no improvement. Suspect pneumonitis possibly from vaping. Satting well on RA. If no improvement in 2 d, will send in Zpak.  The patient voiced understanding and agreement to the plan.  [place], DO 08/24/22  10:06 AM

## 2022-08-24 NOTE — Addendum Note (Signed)
Addended by: Scharlene Gloss B on: 08/24/2022 10:16 AM   Modules accepted: Orders

## 2022-09-26 ENCOUNTER — Encounter: Payer: Self-pay | Admitting: Family Medicine

## 2022-09-26 ENCOUNTER — Ambulatory Visit: Payer: No Typology Code available for payment source | Admitting: Family Medicine

## 2022-09-26 VITALS — BP 118/72 | HR 115 | Ht 70.0 in | Wt 207.6 lb

## 2022-09-26 DIAGNOSIS — L409 Psoriasis, unspecified: Secondary | ICD-10-CM

## 2022-09-26 MED ORDER — CLOBETASOL PROPIONATE 0.05 % EX SHAM: MEDICATED_SHAMPOO | CUTANEOUS | 0 refills | Status: AC

## 2022-09-26 NOTE — Progress Notes (Signed)
Chief Complaint  Patient presents with   Abrasion    Onset several years ago  Rash on the back of neck  Itches     Shane [name] is a 22 y.o. male here for a skin complaint.  Duration: several years Location: back of head/neck Pruritic? Yes Painful? Sometimes Drainage? No New soaps/lotions/topicals/detergents? No Sick contacts? No Other associated symptoms: it does flake Therapies tried thus far: dandruff shampoos without relief  Past Medical History:  Diagnosis Date   ADHD    Anxiety    Compressed spine fracture ([place]) 2015   L6   Depression     BP 118/72 (BP Location: Left Arm, Patient Position: Sitting, Cuff Size: Large)   Pulse (!) 115   Ht 5\' 10"  (1.778 m)   Wt 207 lb 9.6 oz (94.2 kg)   SpO2 98%   BMI 29.79 kg/m  Gen: awake, alert, appearing stated age Lungs: No accessory muscle use Skin: See below. +scaling, excoriation, raised plaque w pink base; No drainage, erythema, TTP, fluctuance Psych: Age appropriate judgment and insight    Psoriasis of scalp - Plan: Ambulatory referral to Dermatology, Clobetasol Propionate 0.05 % shampoo  This looks like psoriasis of the scalp.  2 weeks of clobetasol shampoo, may need to transition to solution if insurance does not cover.  I will place a contingency referral to the dermatology team though he may not need to keep this.  Avoid scented products.  Try not to scratch. F/u prn. The patient voiced understanding and agreement to the plan.  [place], DO 09/26/22 2:24 PM

## 2022-09-26 NOTE — Patient Instructions (Signed)
If you do not hear anything about your referral in the next 1-2 weeks, call our office and ask for an update.  Let me know if there are cost issues with the shampoo. Sometimes we have to do a solution instead.  Try not to scratch as this can make things worse. Avoid scented products while dealing with this. You may resume when the itchiness resolves. Cold/cool compresses can help.   Let us know if you need anything.

## 2022-12-13 ENCOUNTER — Encounter: Payer: Self-pay | Admitting: Family Medicine

## 2022-12-13 ENCOUNTER — Ambulatory Visit (INDEPENDENT_AMBULATORY_CARE_PROVIDER_SITE_OTHER): Payer: No Typology Code available for payment source | Admitting: Family Medicine

## 2022-12-13 VITALS — BP 130/84 | HR 92 | Temp 97.6°F | Ht 70.0 in | Wt 213.0 lb

## 2022-12-13 DIAGNOSIS — L989 Disorder of the skin and subcutaneous tissue, unspecified: Secondary | ICD-10-CM

## 2022-12-13 MED ORDER — DOXYCYCLINE HYCLATE 100 MG PO TABS: 100.0000 mg | ORAL_TABLET | Freq: Two times a day (BID) | ORAL | 0 refills | Status: AC

## 2022-12-13 MED ORDER — TRAMADOL HCL 50 MG PO TABS: 50.0000 mg | ORAL_TABLET | Freq: Two times a day (BID) | ORAL | 0 refills | Status: AC | PRN

## 2022-12-13 NOTE — Patient Instructions (Addendum)
Ibuprofen 400-600 mg (2-3 over the counter strength tabs) every 6 hours as needed for pain.  Ice/cold pack over area for 10-15 min twice daily.  OK to take Tylenol 1000 mg (2 extra strength tabs) or 975 mg (3 regular strength tabs) every 6 hours as needed.  When you do wash it, use only soap and water. Do not vigorously scrub. Keep the area clean and dry.   Things to look out for: increasing pain not relieved by ibuprofen/acetaminophen, fevers, spreading redness, drainage of pus, or foul odor.  Let us know if you need anything.

## 2022-12-13 NOTE — Progress Notes (Signed)
Chief Complaint  Patient presents with   Wound Check    Injured leg playing basketball    Shane [name] is a 22 y.o. male here for a skin complaint.  Duration: 10 days Location: Left leg Pruritic? No Painful? Yes Drainage? No New soaps/lotions/topicals/detergents? No Fell on asphalt while playing basketball outside. Other associated symptoms: Swelling, scaling; no fevers or spreading redness Therapies tried thus far: Tylenol, ibuprofen, triple antibiotic ointment  Past Medical History:  Diagnosis Date   ADHD    Anxiety    Compressed spine fracture ([facility]) 2015   L6   Depression     BP 130/84 (BP Location: Left Arm, Patient Position: Sitting, Cuff Size: Normal)   Pulse 92   Temp 97.6 F (36.4 C) (Oral)   Ht 5\' 10"  (1.778 m)   Wt 213 lb (96.6 kg)   SpO2 98%   BMI 30.56 kg/m  Gen: awake, alert, appearing stated age Lungs: No accessory muscle use Skin: Excoriated lesion over the Antero lateral left leg.  There is warmth compared to surrounding tissue, TTP, soft tissue swelling, and some honey crusted lesions.  Psych: Age appropriate judgment and insight  Skin lesion - Plan: doxycycline (VIBRA-TABS) 100 MG tablet, traMADol (ULTRAM) 50 MG tablet  Will send in 7 days of doxycycline with concern for possible infection.  Tramadol at night, use as needed.  Continue ibuprofen, Tylenol, ice. Warning signs and symptoms verbalized and written down in AVS. Letter for work provided as well. F/u prn. The patient voiced understanding and agreement to the plan.  [place], DO 12/13/22 10:46 AM

## 2023-01-14 DIAGNOSIS — F419 Anxiety disorder, unspecified: Secondary | ICD-10-CM | POA: Insufficient documentation

## 2023-05-15 ENCOUNTER — Ambulatory Visit: Payer: No Typology Code available for payment source | Admitting: Family Medicine

## 2023-05-15 ENCOUNTER — Encounter: Payer: Self-pay | Admitting: Family Medicine

## 2023-05-15 ENCOUNTER — Ambulatory Visit (HOSPITAL_BASED_OUTPATIENT_CLINIC_OR_DEPARTMENT_OTHER)
Admission: RE | Admit: 2023-05-15 | Discharge: 2023-05-15 | Disposition: A | Discharge status: Home / Self Care | Payer: No Typology Code available for payment source | Source: Ambulatory Visit | Attending: Family Medicine | Admitting: Family Medicine

## 2023-05-15 VITALS — BP 114/70 | HR 98 | Ht 70.0 in | Wt 205.0 lb

## 2023-05-15 DIAGNOSIS — M25512 Pain in left shoulder: Secondary | ICD-10-CM | POA: Diagnosis present

## 2023-05-15 MED ORDER — PREDNISONE 20 MG PO TABS: 40.0000 mg | ORAL_TABLET | Freq: Every day | ORAL | 0 refills | Status: AC

## 2023-05-15 MED ORDER — MELOXICAM 15 MG PO TABS: 15.0000 mg | ORAL_TABLET | Freq: Every day | ORAL | 0 refills | Status: DC

## 2023-05-15 NOTE — Patient Instructions (Signed)
Xray today Referral to sports medicine Starting with prednisone burst, followed by meloxicam (Do not take together. Do not take with ibuprofen or Aleve.) Rest, ice, avoid pain-provoking activities

## 2023-05-15 NOTE — Progress Notes (Signed)
Acute Office Visit  Subjective:     Patient ID: Shane [name], male    DOB: 07-Mar-2001, 22 y.o.   MRN: [id number]  Chief Complaint  Patient presents with   Shoulder Pain     Patient is in today for shoulder pain   Shoulder Pain: Patient complaints of left shoulder pain. This is evaluated as a personal injury. The pain is described as aching, shooting, and throbbing.  The onset of the pain was sudden, related to throwing a trash bag into the dumpster at work today. Mechanism of injury: throwing.  The pain occurs when active and at rest. Location is global. No history of dislocation. Symptoms are aggravated by reaching, lifting, throwing, work at or above shoulder height. Symptoms are diminished by  medication: NSAID used and beneficial.  Reports he heard/felt a "pop" at the time of injury and has since has throbbing pain deep in the shoulder anteriorly/posteriorly, with shooting pain/tingling down arm intermittently. No swelling, bruising or loss of function.   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    ROS All review of systems negative except what is listed in the HPI      Objective:    BP 114/70   Pulse 98   Ht 5\' 10"  (1.778 m)   Wt 205 lb (93 kg)   SpO2 100%   BMI 29.41 kg/m    Physical Exam Vitals reviewed.  Constitutional:      Appearance: Normal appearance.  Musculoskeletal:        General: No swelling or deformity.     Comments: Left shoulde: TTP anterior/posterior, pain with all of ROM, worse with with Apley scratch test, Hawkins/Neers, Abduction  Skin:    General: Skin is warm and dry.  Neurological:     Mental Status: He is alert and oriented to person, place, and time.  Psychiatric:        Mood and Affect: Mood normal.        Behavior: Behavior normal.        Thought Content: Thought content normal.     No results found for any visits on 05/15/23.      Assessment & Plan:   Problem List Items Addressed  This Visit   None Visit Diagnoses     Acute pain of left shoulder    -  Primary   Relevant Medications   predniSONE (DELTASONE) 20 MG tablet   meloxicam (MOBIC) 15 MG tablet   Other Relevant Orders   DG Shoulder Left   Ambulatory referral to Sports Medicine     Xray today Referral to sports medicine Starting with prednisone burst, followed by meloxicam (Do not take together. Do not take with ibuprofen or Aleve.) Rest, ice, avoid pain-provoking activities        Meds ordered this encounter  Medications   predniSONE (DELTASONE) 20 MG tablet    Sig: Take 2 tablets (40 mg total) by mouth daily with breakfast for 5 days.    Dispense:  10 tablet    Refill:  0    Order Specific Question:   Supervising Provider    Answer:   Danise Edge A [[id number]]   meloxicam (MOBIC) 15 MG tablet    Sig: Take 1 tablet (15 mg total) by mouth daily.    Dispense:  30 tablet    Refill:  0    Order Specific Question:   Supervising Provider    Answer:   Danise Edge A [[id number]]  Return if symptoms worsen or fail to improve.  Clayborne Dana, NP

## 2023-05-24 ENCOUNTER — Ambulatory Visit (INDEPENDENT_AMBULATORY_CARE_PROVIDER_SITE_OTHER): Payer: No Typology Code available for payment source | Admitting: Sports Medicine

## 2023-05-24 ENCOUNTER — Encounter: Payer: Self-pay | Admitting: Sports Medicine

## 2023-05-24 VITALS — BP 130/82 | Ht 70.0 in | Wt 205.0 lb

## 2023-05-24 DIAGNOSIS — M25512 Pain in left shoulder: Secondary | ICD-10-CM | POA: Diagnosis not present

## 2023-05-24 NOTE — Progress Notes (Signed)
   Subjective:    Patient ID: Shane [name], male    DOB: 2000-12-15, 22 y.o.   MRN: [id number]  HPI chief complaint: Left shoulder pain  Patient is a pleasant 22 year old ambidextrous gentleman that presents today with left shoulder pain.  He injured his shoulder while at work.  He was throwing a bag of trash into a dumpster when he felt a pop in the shoulder with immediate pain.  His arm was in an abducted position as he held the bag of trash.  Injury occurred last Monday.  He saw his PCP and x-rays were ordered which are unremarkable.  He was placed on a prednisone taper with instructions to follow that with meloxicam.  That has been helpful.  Pain is diffuse around the shoulder.  He does endorse some intermittent numbness and tingling down the left arm.  He suffered a rotator cuff strain in this same shoulder when playing baseball a few years ago.  No prior shoulder surgeries.  Past medical history reviewed Medications reviewed Allergies reviewed    Review of Systems As above    Objective:   Physical Exam  Well-developed, well-nourished.  No acute distress  Left shoulder: No gross deformity.  No tenderness to palpation.  Full range of motion.  Positive painful arc.  Positive empty can.  Rotator cuff strength is 5/5.  Negative O'Brien's.  Reflexes are equal at the biceps, triceps, and brachial radialis tendons bilaterally.  Good pulses distally.  X-rays of the left shoulder are as above      Assessment & Plan:   Left shoulder pain possibly secondary to subluxation versus rotator cuff strain  Since the patient's symptoms are improving we will take a watchful waiting approach at this time.  I would look for his symptoms to continue to improve over the next couple of weeks.  I did caution him about using his left arm away from his body with any sort of resistance such as heavy objects for the next several weeks.  We also discussed the possibility of physical therapy if symptoms  persist.  He will follow-up for ongoing or recalcitrant issues.  This note was dictated using Dragon naturally speaking software and may contain errors in syntax, spelling, or content which have not been identified prior to signing this note.

## 2023-05-26 ENCOUNTER — Encounter: Payer: Self-pay | Admitting: Family Medicine

## 2023-05-26 ENCOUNTER — Ambulatory Visit (INDEPENDENT_AMBULATORY_CARE_PROVIDER_SITE_OTHER): Payer: No Typology Code available for payment source | Admitting: Family Medicine

## 2023-05-26 VITALS — BP 108/79 | HR 90 | Temp 98.1°F | Ht 70.0 in | Wt 204.0 lb

## 2023-05-26 DIAGNOSIS — Z111 Encounter for screening for respiratory tuberculosis: Secondary | ICD-10-CM

## 2023-05-26 DIAGNOSIS — Z0000 Encounter for general adult medical examination without abnormal findings: Secondary | ICD-10-CM | POA: Diagnosis not present

## 2023-05-26 LAB — COMPREHENSIVE METABOLIC PANEL
ALT: 21 U/L (ref 0–53)
AST: 17 U/L (ref 0–37)
Albumin: 4.4 g/dL (ref 3.5–5.2)
Alkaline Phosphatase: 101 U/L (ref 39–117)
BUN: 17 mg/dL (ref 6–23)
CO2: 32 meq/L (ref 19–32)
Calcium: 9.8 mg/dL (ref 8.4–10.5)
Chloride: 99 meq/L (ref 96–112)
Creatinine, Ser: 1.16 mg/dL (ref 0.40–1.50)
GFR: 89.3 mL/min (ref >60.00)
Glucose, Bld: 93 mg/dL (ref 70–99)
Potassium: 5.1 meq/L (ref 3.5–5.1)
Sodium: 138 meq/L (ref 135–145)
Total Bilirubin: 1.2 mg/dL (ref 0.2–1.2)
Total Protein: 7 g/dL (ref 6.0–8.3)

## 2023-05-26 LAB — CBC
HCT: 47.6 % (ref 39.0–52.0)
Hemoglobin: 16 g/dL (ref 13.0–17.0)
MCHC: 33.6 g/dL (ref 30.0–36.0)
MCV: 92.9 fl (ref 78.0–100.0)
Platelets: 232 10*3/uL (ref 150.0–400.0)
RBC: 5.12 Mil/uL (ref 4.22–5.81)
RDW: 13 % (ref 11.5–15.5)
WBC: 5.3 10*3/uL (ref 4.0–10.5)

## 2023-05-26 LAB — LIPID PANEL
Cholesterol: 207 mg/dL — ABNORMAL HIGH (ref 0–200)
HDL: 47.5 mg/dL (ref >39.00)
LDL Cholesterol: 144 mg/dL — ABNORMAL HIGH (ref 0–99)
NonHDL: 159.01
Total CHOL/HDL Ratio: 4
Triglycerides: 77 mg/dL (ref 0.0–149.0)
VLDL: 15.4 mg/dL (ref 0.0–40.0)

## 2023-05-26 NOTE — Patient Instructions (Signed)
Give us 2-3 business days to get the results of your labs back.   Keep the diet clean and stay active.  I recommend getting the flu shot in mid October. This suggestion would change if the CDC comes out with a different recommendation.   Do monthly self testicular checks in the shower. You are feeling for lumps/bumps that don't belong. If you feel anything like this, let me know!  Please get me a copy of your advanced directive form at your convenience.   Let us know if you need anything.  

## 2023-05-26 NOTE — Progress Notes (Signed)
Chief Complaint  Patient presents with   Annual Exam    Well Male Shane [name] is here for a complete physical.   His last physical was >1 year ago.  Current diet: in general, an "unhealthy" diet.   Current exercise: none Weight trend: stable Fatigue out of ordinary? No. Seat belt? Yes.   Advanced directive? No  Health maintenance Tetanus- Yes HIV- Yes Hep C- Yes  Past Medical History:  Diagnosis Date   ADHD    Anxiety    Compressed spine fracture ([facility]) 2015   L6   Depression      Past Surgical History:  Procedure Laterality Date   NO PAST SURGERIES      Medications  Current Outpatient Medications on File Prior to Visit  Medication Sig Dispense Refill   atomoxetine (STRATTERA) 80 MG capsule TAKE 1 CAPSULE BY MOUTH EVERY DAY 90 capsule 1   Clobetasol Propionate 0.05 % shampoo Use twice daily for 14 days. 118 mL 0   FLUoxetine (PROZAC) 40 MG capsule TAKE 1 CAPSULE BY MOUTH EVERY DAY 90 capsule 1   ibuprofen (ADVIL,MOTRIN) 800 MG tablet Take 1 tablet (800 mg total) by mouth 3 (three) times daily. 21 tablet 0   lamoTRIgine (LAMICTAL) 200 MG tablet Take 1 tablet (200 mg total) by mouth at bedtime. 90 tablet 0   loratadine (CLARITIN) 10 MG tablet Take 10 mg by mouth daily.     meloxicam (MOBIC) 15 MG tablet Take 1 tablet (15 mg total) by mouth daily. 30 tablet 0   Allergies Allergies  Allergen Reactions   Amoxicillin    Cantaloupe (Diagnostic)     Family History Family History  Problem Relation Age of Onset   Thyroid disease Mother    Anxiety disorder Mother    Depression Mother    Hypertension Father    Hyperlipidemia Father    Hypertension Paternal Grandmother    Hyperlipidemia Paternal Grandfather     Review of Systems: Constitutional: no fevers or chills Eye:  no recent significant change in vision Ear/Nose/Mouth/Throat:  Ears:  no hearing loss Nose/Mouth/Throat:  no complaints of nasal congestion, no sore throat Cardiovascular:  no chest  pain Respiratory:  no shortness of breath Gastrointestinal:  no abdominal pain, no change in bowel habits GU:  Male: negative for dysuria Musculoskeletal/Extremities:  no pain of the joints Integumentary (Skin/Breast):  no abnormal skin lesions reported Neurologic:  no headaches Endocrine: No unexpected weight changes Hematologic/Lymphatic:  no night sweats  Exam BP 108/79 (BP Location: Left Arm, Patient Position: Sitting, Cuff Size: Large)   Pulse 90   Temp 98.1 F (36.7 C) (Oral)   Ht 5\' 10"  (1.778 m)   Wt 204 lb (92.5 kg)   SpO2 99%   BMI 29.27 kg/m  General:  well developed, well nourished, in no apparent distress Skin:  no significant moles, warts, or growths Head:  no masses, lesions, or tenderness Eyes:  pupils equal and round, sclera anicteric without injection Ears:  canals without lesions, TMs shiny without retraction, no obvious effusion, no erythema Nose:  nares patent, mucosa normal Throat/Pharynx:  lips and gingiva without lesion; tongue and uvula midline; non-inflamed pharynx; no exudates or postnasal drainage Neck: neck supple without adenopathy, thyromegaly, or masses Lungs:  clear to auscultation, breath sounds equal bilaterally, no respiratory distress Cardio:  regular rate and rhythm, no bruits, no LE edema Abdomen:  abdomen soft, nontender; bowel sounds normal; no masses or organomegaly Genital (male): Deferred Rectal: Deferred Musculoskeletal:  symmetrical muscle groups noted  without atrophy or deformity Extremities:  no clubbing, cyanosis, or edema, no deformities, no skin discoloration Neuro:  gait normal; deep tendon reflexes normal and symmetric Psych: well oriented with normal range of affect and appropriate judgment/insight  Assessment and Plan  Well adult exam - Plan: CBC, Comprehensive metabolic panel, Lipid panel  Screening-pulmonary TB - Plan: QuantiFERON-TB Gold Plus   Well 22 y.o. male. Counseled on diet and exercise. Self testicular  exams recommended at least monthly.  Screening Tb test for psych.  Advanced directive form provided today.  Other orders as above. Follow up in 1 year pending the above workup. The patient voiced understanding and agreement to the plan.  Jilda Roche [place], DO 05/26/23 10:04 AM

## 2023-05-28 LAB — QUANTIFERON-TB GOLD PLUS
Mitogen-NIL: 6.52 [IU]/mL
NIL: 0.01 [IU]/mL
QuantiFERON-TB Gold Plus: NEGATIVE
TB1-NIL: 0 [IU]/mL
TB2-NIL: 0.01 [IU]/mL

## 2023-07-27 ENCOUNTER — Emergency Department (HOSPITAL_BASED_OUTPATIENT_CLINIC_OR_DEPARTMENT_OTHER)
Admission: EM | Admit: 2023-07-27 | Discharge: 2023-07-27 | Disposition: A | Discharge status: Home / Self Care | Payer: No Typology Code available for payment source | Attending: Emergency Medicine | Admitting: Emergency Medicine

## 2023-07-27 ENCOUNTER — Encounter (HOSPITAL_BASED_OUTPATIENT_CLINIC_OR_DEPARTMENT_OTHER): Payer: Self-pay | Admitting: Emergency Medicine

## 2023-07-27 ENCOUNTER — Other Ambulatory Visit: Payer: Self-pay

## 2023-07-27 ENCOUNTER — Emergency Department (HOSPITAL_BASED_OUTPATIENT_CLINIC_OR_DEPARTMENT_OTHER): Payer: No Typology Code available for payment source

## 2023-07-27 ENCOUNTER — Ambulatory Visit: Payer: No Typology Code available for payment source | Admitting: Family Medicine

## 2023-07-27 ENCOUNTER — Encounter: Payer: Self-pay | Admitting: Family Medicine

## 2023-07-27 VITALS — BP 132/91 | HR 104 | Temp 98.4°F | Ht 70.0 in | Wt 207.0 lb

## 2023-07-27 DIAGNOSIS — R339 Retention of urine, unspecified: Secondary | ICD-10-CM | POA: Diagnosis not present

## 2023-07-27 DIAGNOSIS — N50819 Testicular pain, unspecified: Secondary | ICD-10-CM | POA: Diagnosis not present

## 2023-07-27 DIAGNOSIS — N50812 Left testicular pain: Secondary | ICD-10-CM | POA: Diagnosis present

## 2023-07-27 DIAGNOSIS — I861 Scrotal varices: Secondary | ICD-10-CM | POA: Diagnosis not present

## 2023-07-27 LAB — URINALYSIS, ROUTINE W REFLEX MICROSCOPIC
Bilirubin Urine: NEGATIVE
Glucose, UA: NEGATIVE mg/dL
Hgb urine dipstick: NEGATIVE
Ketones, ur: NEGATIVE mg/dL
Leukocytes,Ua: NEGATIVE
Nitrite: NEGATIVE
Protein, ur: NEGATIVE mg/dL
Specific Gravity, Urine: >=1.03 (ref 1.005–1.030)
pH: 6 (ref 5.0–8.0)

## 2023-07-27 NOTE — ED Provider Notes (Signed)
[place] [facility] Provider Note  CSN: [id number] Arrival date & time: 07/27/23 1542  Chief Complaint(s) Testicle Pain  HPI Shane [name] is a 22 y.o. male with past medical history as below, significant for ADHD, anxiety, depression, L3 compression fracture, borderline personality disorder who presents to the ED with complaint of testicle pain  Reports left testicle pain ongoing today.  Some pain also to the right testicle but left testicle is more uncomfortable.  He was seen by his PCP earlier today who advised him to come to the ER for evaluation of a possible torsion.  Reports he has not urinated since the pain began.  Noticed abnormal discharge of his penis.  No rectal pain, no change to bowel movements, no BRBPR or melena.  No suprapubic abdominal pain.  Reports does have some mild discomfort to his right inguinal area which has since improved.  He is not concerned for STI, no sexual activity in the past few months.  No GU trauma. No abd surgery hx.    Past Medical History Past Medical History:  Diagnosis Date   ADHD    Anxiety    Compressed spine fracture ([facility]) 2015   L6   Depression    Patient Active Problem List   Diagnosis Date Noted   Anxiety disorder, unspecified 01/14/2023   Borderline personality disorder (HCC) 08/11/2021   Hypochondriasis 06/11/2019   Bipolar 2 disorder (HCC) 04/29/2019   Attention deficit hyperactivity disorder (ADHD) 04/29/2019   Home Medication(s) Prior to Admission medications   Medication Sig Start Date End Date Taking? Authorizing Provider  atomoxetine (STRATTERA) 80 MG capsule TAKE 1 CAPSULE BY MOUTH EVERY DAY 11/11/20   Pucilowski, Olgierd A, MD  Clobetasol Propionate 0.05 % shampoo Use twice daily for 14 days. 09/26/22   Sharlene Dory, DO  FLUoxetine (PROZAC) 40 MG capsule TAKE 1 CAPSULE BY MOUTH EVERY DAY 11/11/20   Pucilowski, Roosvelt Maser, MD  ibuprofen (ADVIL,MOTRIN) 800 MG tablet Take 1  tablet (800 mg total) by mouth 3 (three) times daily. 05/27/17   Arby Barrette, MD  lamoTRIgine (LAMICTAL) 200 MG tablet Take 1 tablet (200 mg total) by mouth at bedtime. 01/21/21   Pucilowski, Roosvelt Maser, MD  loratadine (CLARITIN) 10 MG tablet Take 10 mg by mouth daily.    [provider]  meloxicam (MOBIC) 15 MG tablet Take 1 tablet (15 mg total) by mouth daily. 05/15/23   Clayborne Dana, NP                                                                                                                                    Past Surgical History Past Surgical History:  Procedure Laterality Date   NO PAST SURGERIES     Family History Family History  Problem Relation Age of Onset   Thyroid disease Mother    Anxiety disorder Mother    Depression Mother  Hypertension Father    Hyperlipidemia Father    Hypertension Paternal Grandmother    Hyperlipidemia Paternal Grandfather     Social History Social History   Tobacco Use   Smoking status: Never   Smokeless tobacco: Never  Vaping Use   Vaping status: Never Used  Substance Use Topics   Alcohol use: No   Drug use: No   Allergies Amoxicillin and Cantaloupe (diagnostic)  Review of Systems Review of Systems  Genitourinary:  Positive for decreased urine volume, scrotal swelling and testicular pain.  All other systems reviewed and are negative.   Physical Exam Vital Signs  I have reviewed the triage vital signs BP (!) 141/88   Pulse 98   Temp 98.3 F (36.8 C) (Oral)   Resp 18   Ht 5\' 10"  (1.778 m)   Wt 93 kg   SpO2 99%   BMI 29.41 kg/m  Physical Exam Vitals and nursing note reviewed.  Constitutional:      General: He is not in acute distress.    Appearance: He is well-developed.  HENT:     Head: Normocephalic and atraumatic.     Right Ear: External ear normal.     Left Ear: External ear normal.     Mouth/Throat:     Mouth: Mucous membranes are moist.  Eyes:     General: No scleral icterus. Cardiovascular:      Rate and Rhythm: Normal rate and regular rhythm.     Pulses: Normal pulses.     Heart sounds: Normal heart sounds.  Pulmonary:     Effort: Pulmonary effort is normal. No respiratory distress.     Breath sounds: Normal breath sounds.  Abdominal:     General: Abdomen is flat.     Palpations: Abdomen is soft.     Tenderness: There is no abdominal tenderness. There is no guarding or rebound.     Hernia: There is no hernia in the left inguinal area or right inguinal area.  Genitourinary:    Pubic Area: No rash.      Penis: Normal.      Testes: Cremasteric reflex is present.        Left: Tenderness present.  Musculoskeletal:     Cervical back: No rigidity.     Right lower leg: No edema.     Left lower leg: No edema.  Skin:    General: Skin is warm and dry.     Capillary Refill: Capillary refill takes less than 2 seconds.  Neurological:     Mental Status: He is alert.  Psychiatric:        Mood and Affect: Mood normal.        Behavior: Behavior normal.     ED Results and Treatments Labs (all labs ordered are listed, but only abnormal results are displayed) Labs Reviewed  URINALYSIS, ROUTINE W REFLEX MICROSCOPIC  Radiology US SCROTUM W/DOPPLER  Result Date: 07/27/2023 CLINICAL DATA:  Left testicular pain EXAM: SCROTAL ULTRASOUND DOPPLER ULTRASOUND OF THE TESTICLES TECHNIQUE: Complete ultrasound examination of the testicles, epididymis, and other scrotal structures was performed. Color and spectral Doppler ultrasound were also utilized to evaluate blood flow to the testicles. COMPARISON:  None Available. FINDINGS: Right testicle Measurements: 5.0 x 2.4 x 3.0 cm. No mass or microlithiasis visualized. Left testicle Measurements: 4.3 x 2.2 x 2.9 cm. No mass or microlithiasis visualized. Right epididymis:  Normal in size and appearance. Left epididymis:  Normal in  size and appearance. Hydrocele:  None visualized. Varicocele:  Left-sided varicoceles. Pulsed Doppler interrogation of both testes demonstrates normal low resistance arterial and venous waveforms bilaterally. IMPRESSION: 1. No evidence of testicular torsion. 2. Left-sided varicoceles. Electronically Signed   By: Allegra Lai M.D.   On: 07/27/2023 18:28    Pertinent labs & imaging results that were available during my care of the patient were reviewed by me and considered in my medical decision making (see MDM for details).  Medications Ordered in ED Medications - No data to display                                                                                                                                   Procedures Procedures  (including critical care time)  Medical Decision Making / ED Course    Medical Decision Making:    Shane [name] is a 22 y.o. male with past medical history as below, significant for ADHD, anxiety, depression, L3 compression fracture, borderline personality disorder who presents to the ED with complaint of testicle pain. The complaint involves an extensive differential diagnosis and also carries with it a high risk of complications and morbidity.  Serious etiology was considered. Ddx includes but is not limited to: Epididymitis, UTI, torsion, hydrocele, varicocele, etc.  Complete initial physical exam performed, notably the patient  was no acute distress, sitting comfortably on stretcher.    Reviewed and confirmed nursing documentation for past medical history, family history, social history.  Vital signs reviewed.    Clinical Course as of 07/27/23 1954  Thu Jul 27, 2023  1903 Left sided varicocele noted on ultrasound  [SG]    Clinical Course User Index [SG] Sloan Leiter, DO     Will get scrotal ultrasound, urinalysis  UA looks okay, no infection U/S with varicocele, no torsion  Patient reports he is feeling somewhat better.  Advised upon  results, recommend follow-up with urology and supportive underwear.  Return precautions discussed.  The patient improved significantly and was discharged in stable condition. Detailed discussions were had with the patient regarding current findings, and need for close f/u with PCP or on call doctor. The patient has been instructed to return immediately if the symptoms worsen in any way for re-evaluation. Patient verbalized understanding and is in agreement with current care plan. All questions answered prior to discharge.  Additional history obtained: -Additional history obtained from na -External records from outside source obtained and reviewed including: Chart review including previous notes, labs, imaging, consultation notes including  Primary care documentation Home medications   Lab Tests: -I ordered, reviewed, and interpreted labs.   The pertinent results include:   Labs Reviewed  URINALYSIS, ROUTINE W REFLEX MICROSCOPIC    Notable for UA without infection  EKG   EKG Interpretation Date/Time:    Ventricular Rate:    PR Interval:    QRS Duration:    QT Interval:    QTC Calculation:   R Axis:      Text Interpretation:           Imaging Studies ordered: I ordered imaging studies including scrotal ultrasound I independently visualized the following imaging with scope of interpretation limited to determining acute life threatening conditions related to emergency care; findings noted above I independently visualized and interpreted imaging. I agree with the radiologist interpretation   Medicines ordered and prescription drug management: No orders of the defined types were placed in this encounter.   -I have reviewed the patients home medicines and have made adjustments as needed   Consultations Obtained: Not applicable  Cardiac Monitoring: Continuous pulse oximetry interpreted by myself, 100% on RA.    Social Determinants of  Health:  Diagnosis or treatment significantly limited by social determinants of health: non smoker, lives at home   Reevaluation: After the interventions noted above, I reevaluated the patient and found that they have improved  Co morbidities that complicate the patient evaluation  Past Medical History:  Diagnosis Date   ADHD    Anxiety    Compressed spine fracture (HCC) 2015   L6   Depression       Dispostion: Disposition decision including need for hospitalization was considered, and patient discharged from emergency department.    Final Clinical Impression(s) / ED Diagnoses Final diagnoses:  Varicocele        Sloan Leiter, DO 07/27/23 1954

## 2023-07-27 NOTE — ED Notes (Signed)
1045hrs, urinated without issue. Instant sudden onset afterwards of L anterior testicular pain and tenderness. Tearing sensation scrotum since then, red skin at one point but has resolved. Also says his LRQ and medial groin pain that started at the same time. Also advised he has the sensation to pee but cannot urinate.

## 2023-07-27 NOTE — Discharge Instructions (Addendum)
It was a pleasure caring for you today in the emergency department.  Please wear supportive underwear. Follow up with urology for further evaluation.  He can take over-the-counter Tylenol or NSAIDs as needed for discomfort.  Please return to the emergency department for any worsening or worrisome symptoms; such as significant pain and swelling to your testicle, difficulty urinating, abnormal discharge from your penis, color change to your scrotal tissue, any worsening worrisome symptoms.

## 2023-07-27 NOTE — Progress Notes (Signed)
   Acute Office Visit  Subjective:     Patient ID: Shane [name], male    DOB: [birth year], 22 y.o.   MRN: [id number]  Chief Complaint  Patient presents with   Abdominal Pain     Patient is in today for testicular pain   Discussed the use of AI scribe software for clinical note transcription with the patient, who gave verbal consent to proceed.  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 The discomfort, described as a gripping sensation, began around 11:00 AM, following a break at work. The pain was initially localized to the scrotum but later radiated to the right lower quadrant. The patient also reported an inability to urinate despite a usual pattern of frequent urination due to high fluid intake at work.  Attempts to alleviate the pain with over-the-counter acetaminophen were unsuccessful. The patient denied any recent heavy lifting or abnormal activities that could have precipitated the pain.   The patient's last sexual encounter reported as approximately six to seven months prior with a known partner. The patient denied any recent sexual activity, reducing the likelihood of a sexually transmitted disease as a cause for the current symptoms.             All review of systems negative except what is listed in the HPI      Objective:    BP (!) 132/91   Pulse (!) 104   Temp 98.4 F (36.9 C) (Oral)   Ht 5\' 10"  (1.778 m)   Wt 207 lb (93.9 kg)   SpO2 99%   BMI 29.70 kg/m     Physical Exam Vitals reviewed. Exam conducted with a chaperone present.  Constitutional:      Appearance: Normal appearance.  Genitourinary:    Testes:        Right: Tenderness not present.        Left: Tenderness present.     Comments: High-riding left testicle, unknown baseline; very tender to light palpation Musculoskeletal:        General: Normal range of motion.   Neurological:     Mental Status: He is alert and oriented to person, place, and time.  Psychiatric:        Mood and Affect: Mood normal.        Behavior: Behavior normal.        Thought Content: Thought content normal.        Judgment: Judgment normal.         No results found for any visits on 07/27/23.      Assessment & Plan:   Problem List Items Addressed This Visit   None Visit Diagnoses     Pain in testicle, unspecified laterality    -  Primary   Urinary retention         Given sudden onset, severity of symptoms, and inability to urinate recommend ED evaluation. Patient agreeable to go to [facility] ED.  No orders of the defined types were placed in this encounter.   Return if symptoms worsen or fail to improve.  Clayborne Dana, NP

## 2023-07-27 NOTE — ED Triage Notes (Signed)
Pt POV steady gait- c/o BL testical pain, worse on L, starting appx 1100 today.  Denies swelling. 20f10i  Denies urinary sx. Reports no urine output since 1100.

## 2023-10-18 ENCOUNTER — Encounter: Payer: Self-pay | Admitting: Family Medicine

## 2023-10-18 ENCOUNTER — Ambulatory Visit: Payer: No Typology Code available for payment source | Admitting: Family Medicine

## 2023-10-18 ENCOUNTER — Ambulatory Visit: Payer: Self-pay | Admitting: Family Medicine

## 2023-10-18 VITALS — BP 132/84 | HR 102 | Temp 98.0°F | Resp 16 | Ht 70.0 in | Wt 221.2 lb

## 2023-10-18 DIAGNOSIS — J069 Acute upper respiratory infection, unspecified: Secondary | ICD-10-CM

## 2023-10-18 LAB — POCT RAPID STREP A (OFFICE): Rapid Strep A Screen: NEGATIVE

## 2023-10-18 LAB — POCT INFLUENZA A/B
Influenza A, POC: NEGATIVE
Influenza B, POC: NEGATIVE

## 2023-10-18 LAB — POC COVID19 BINAXNOW

## 2023-10-18 MED ORDER — BENZONATATE 200 MG PO CAPS
200.0000 mg | ORAL_CAPSULE | Freq: Two times a day (BID) | ORAL | 0 refills | Status: DC | PRN
Start: 2023-10-18 — End: 2024-03-21

## 2023-10-18 MED ORDER — PROMETHAZINE-DM 6.25-15 MG/5ML PO SYRP
5.0000 mL | ORAL_SOLUTION | Freq: Every evening | ORAL | 0 refills | Status: DC | PRN
Start: 2023-10-18 — End: 2024-03-21

## 2023-10-18 NOTE — Progress Notes (Signed)
Chief Complaint  Patient presents with   Sore Throat    Sore Throat    Shane [name] here for URI complaints.  Duration: 4 days  Associated symptoms: sinus congestion, rhinorrhea, sore throat, lost voice, and sweats, coughing Denies: sinus pain, itchy watery eyes, ear pain, ear drainage, wheezing, shortness of breath, myalgia, and fevers Treatment to date: Nyquil, Mucinex, Tylenol, ibuprofen Sick contacts: No  Past Medical History:  Diagnosis Date   ADHD    Anxiety    Compressed spine fracture (HCC) 2015   L6   Depression     Objective BP 132/84   Pulse (!) 102   Temp 98 F (36.7 C) (Oral)   Resp 16   Ht 5\' 10"  (1.778 m)   Wt 221 lb 3.2 oz (100.3 kg)   SpO2 95%   BMI 31.74 kg/m  General: Awake, alert, appears stated age HEENT: AT, [place], ears patent b/l and TM's neg, nares patent w/o discharge, pharynx pink and without exudates, MMM, no sinus ttp Neck: No masses or asymmetry Heart: RRR Lungs: CTAB, no accessory muscle use Psych: Age appropriate judgment and insight, normal mood and affect  Viral URI with cough - Plan: promethazine-dextromethorphan (PROMETHAZINE-DM) 6.25-15 MG/5ML syrup, benzonatate (TESSALON) 200 MG capsule  Syrup for night, warned of drowsiness. Perles for day. Continue to push fluids, practice good hand hygiene, cover mouth when coughing. F/u prn. If starting to experience fevers, shaking, or shortness of breath, seek immediate care. Pt voiced understanding and agreement to the plan.  Jilda Roche [place], DO 10/18/23 11:00 AM

## 2023-10-18 NOTE — Patient Instructions (Signed)
OK to take Tylenol 1000 mg (2 extra strength tabs) or 975 mg (3 regular strength tabs) every 6 hours as needed.  Ibuprofen 400-600 mg (2-3 over the counter strength tabs) every 6 hours as needed for pain.  Continue to push fluids, practice good hand hygiene, and cover your mouth if you cough.  If you start having fevers, shaking or shortness of breath, seek immediate care.  Send me a message if we aren't turning the corner.   Let us know if you need anything.

## 2023-10-18 NOTE — Telephone Encounter (Signed)
Copied from CRM [id number]. Topic: Clinical - Red Word Triage >> Oct 18, 2023  7:51 AM Marica Otter wrote: [facility] that prompted transfer to Nurse Triage: Loosing voice, sore throat and cough. No fever, pain in throat. Started Saturday and getting worse   Chief Complaint:    Patent reports Losing voice, sore throat and cough. No fever, pain in throat. Started Saturday and getting worse Symptoms: Sore throat , cough pain in throat Frequency: x 1 week Pertinent Negatives: Patient denies fever. Disposition: [] ED /[] Urgent Care (no appt availability in office) / [x] Appointment(In office/virtual)/ []  [facility]/ [] Home Care/ [] Refused Recommended Disposition /[] [facility]/ []  Follow-up with PCP Additional Notes: Scheduled  for today to be seen.  Reason for Disposition  SEVERE (e.g., excruciating) throat pain  Answer Assessment - Initial Assessment Questions 1. ONSET: "When did the throat start hurting?" (Hours or days ago)       Started a  week ago but has progressed.  2. SEVERITY: "How bad is the sore throat?" (Scale 1-10; mild, moderate or severe) activities.  Rates pain as 8 on pain sccale.   - SEVERE (8-10):  Excruciating pain, interferes with most normal activities.  3. STREP EXPOSURE: "Has there been any exposure to strep within the past week?" If Yes, ask: "What type of contact occurred?"       4.  VIRAL SYMPTOMS: "Are there any symptoms of a cold, such as a runny nose, cough, hoarse voice or red eyes?"      Cough,  Hoarse ness ,  runny nose 5. FEVER: "Do you have a fever?" If Yes, ask: "What is your temperature, how was it measured, and when did it start?"     Unsure taking  tylenol and  ibuprofen 6. PUS ON THE TONSILS: "Is there pus on the tonsils in the back of your throat?"        Has not looked  7. OTHER SYMPTOMS: "Do you have any other symptoms?" (e.g., difficulty breathing, headache, rash)     Headache.  Protocols used: Sore Throat-A-AH

## 2023-10-18 NOTE — Addendum Note (Signed)
Addended by: Kathi Ludwig on: 10/18/2023 12:53 PM   Modules accepted: Orders

## 2023-12-16 ENCOUNTER — Emergency Department (HOSPITAL_BASED_OUTPATIENT_CLINIC_OR_DEPARTMENT_OTHER)
Admission: EM | Admit: 2023-12-16 | Discharge: 2023-12-16 | Disposition: A | Discharge status: Home / Self Care | Attending: Emergency Medicine | Admitting: Emergency Medicine

## 2023-12-16 ENCOUNTER — Other Ambulatory Visit: Payer: Self-pay

## 2023-12-16 ENCOUNTER — Emergency Department (HOSPITAL_BASED_OUTPATIENT_CLINIC_OR_DEPARTMENT_OTHER)

## 2023-12-16 ENCOUNTER — Encounter (HOSPITAL_BASED_OUTPATIENT_CLINIC_OR_DEPARTMENT_OTHER): Payer: Self-pay | Admitting: Emergency Medicine

## 2023-12-16 DIAGNOSIS — S63637A Sprain of interphalangeal joint of left little finger, initial encounter: Secondary | ICD-10-CM | POA: Insufficient documentation

## 2023-12-16 DIAGNOSIS — Y9367 Activity, basketball: Secondary | ICD-10-CM | POA: Insufficient documentation

## 2023-12-16 DIAGNOSIS — W2105XA Struck by basketball, initial encounter: Secondary | ICD-10-CM | POA: Diagnosis not present

## 2023-12-16 DIAGNOSIS — S6992XA Unspecified injury of left wrist, hand and finger(s), initial encounter: Secondary | ICD-10-CM

## 2023-12-16 NOTE — Discharge Instructions (Addendum)
 You were seen today for jammed finger of the left pinky.  The x-ray showed no fracture or dislocation but did show some soft tissue swelling around the finger.  Recommend you continue to rest, ice, compress the area as well as elevate to keep swelling down.  You can use ibuprofen and Tylenol for pain relief.  Take Tylenol (acetominophen)  650mg  every 4-6 hours, as needed for pain or fever. Do not take more than 4,000 mg in a 24-hour period. As this may cause liver damage. While this is rare, if you begin to develop yellowing of the skin or eyes, stop taking and return to ER immediately.  Take Ibuprofen 400mg  every 4-6 hours for pain or fever, not exceeding 3,200 mg per day as more than 3,200mg  can cause Stomach irritation, dizziness, kidney issues with long-term use.  Also recommend that you can buddy tape your fingers together for further pain relief to keep movement minimal.

## 2023-12-16 NOTE — ED Triage Notes (Signed)
 Pt c/o pain to LT 5th digit after jamming it playing basketball tonight

## 2023-12-16 NOTE — ED Provider Notes (Signed)
 [facility] Provider Note   CSN: [id number] Arrival date & time: 12/16/23  2212     History  Chief Complaint  Patient presents with   Finger Injury    Shane [name] is a 23 y.o. male.  HPI Patient is a 23 year old male presents the ED today complaining of left pinky pain at the PIP joint after playing basketball and hitting the ball with his pinky.  Since then he has had decreased range of motion that pinky as well as pain and mild swelling.  Denies numbness, weakness, tingling.    Home Medications Prior to Admission medications   Medication Sig Start Date End Date Taking? Authorizing Provider  atomoxetine (STRATTERA) 80 MG capsule TAKE 1 CAPSULE BY MOUTH EVERY DAY 11/11/20   Pucilowski, Olgierd A, MD  benzonatate (TESSALON) 200 MG capsule Take 1 capsule (200 mg total) by mouth 2 (two) times daily as needed for cough. 10/18/23   Sharlene Dory, DO  Clobetasol Propionate 0.05 % shampoo Use twice daily for 14 days. 09/26/22   Sharlene Dory, DO  FLUoxetine (PROZAC) 40 MG capsule TAKE 1 CAPSULE BY MOUTH EVERY DAY 11/11/20   Pucilowski, Roosvelt Maser, MD  ibuprofen (ADVIL,MOTRIN) 800 MG tablet Take 1 tablet (800 mg total) by mouth 3 (three) times daily. 05/27/17   Arby Barrette, MD  lamoTRIgine (LAMICTAL) 200 MG tablet Take 1 tablet (200 mg total) by mouth at bedtime. 01/21/21   Pucilowski, Roosvelt Maser, MD  loratadine (CLARITIN) 10 MG tablet Take 10 mg by mouth daily.    [provider]  meloxicam (MOBIC) 15 MG tablet Take 1 tablet (15 mg total) by mouth daily. 05/15/23   Clayborne Dana, NP  promethazine-dextromethorphan (PROMETHAZINE-DM) 6.25-15 MG/5ML syrup Take 5 mLs by mouth at bedtime as needed for cough. 10/18/23   Sharlene Dory, DO      Allergies    Amoxicillin and Cantaloupe (diagnostic)    Review of Systems   Review of Systems  Musculoskeletal:  Positive for arthralgias.  All other systems reviewed  and are negative.   Physical Exam Updated Vital Signs BP 130/83 (BP Location: Right Arm)   Pulse 95   Temp 98.4 F (36.9 C)   Resp 15   Ht 5\' 9"  (1.753 m)   Wt 96.6 kg   SpO2 100%   BMI 31.45 kg/m  Physical Exam Vitals and nursing note reviewed.  Constitutional:      General: He is not in acute distress.    Appearance: Normal appearance. He is not ill-appearing.  HENT:     Head: Normocephalic and atraumatic.  Eyes:     Extraocular Movements: Extraocular movements intact.     Conjunctiva/sclera: Conjunctivae normal.  Cardiovascular:     Rate and Rhythm: Normal rate and regular rhythm.     Pulses: Normal pulses.  Pulmonary:     Effort: Pulmonary effort is normal. No respiratory distress.     Breath sounds: Normal breath sounds.  Abdominal:     General: Abdomen is flat.     Palpations: Abdomen is soft.     Tenderness: There is no abdominal tenderness.  Musculoskeletal:        General: Swelling present. No tenderness, deformity or signs of injury.     Cervical back: No rigidity.     Right lower leg: No edema.     Left lower leg: No edema.  Skin:    General: Skin is warm and dry.  Capillary Refill: Capillary refill takes less than 2 seconds.     Coloration: Skin is not pale.     Findings: No bruising, erythema, lesion or rash.  Neurological:     General: No focal deficit present.     Mental Status: He is alert and oriented to person, place, and time. Mental status is at baseline.     Sensory: No sensory deficit.     Motor: No weakness.     Gait: Gait normal.  Psychiatric:        Mood and Affect: Mood normal.     ED Results / Procedures / Treatments   Labs (all labs ordered are listed, but only abnormal results are displayed) Labs Reviewed - No data to display  EKG None  Radiology DG Finger Little Left Result Date: 12/16/2023 CLINICAL DATA:  Left little finger injury, pain EXAM: LEFT FINGER(S) - 2+ VIEW COMPARISON:  None Available. FINDINGS: There is no  evidence of fracture or dislocation. There is no evidence of arthropathy or other focal bone abnormality. There is mild periarticular soft tissue swelling involving the proximal interphalangeal joint. IMPRESSION: 1. Mild periarticular soft tissue swelling. No fracture or dislocation. Electronically Signed   By: Helyn Numbers M.D.   On: 12/16/2023 22:51    Procedures Procedures    Medications Ordered in ED Medications - No data to display  ED Course/ Medical Decision Making/ A&P                                 Medical Decision Making Amount and/or Complexity of Data Reviewed Radiology: ordered.   Patient is a 23 year old male presents the ED today complaining of left pinky pain at the PIP joint after playing basketball and hitting the ball with his pinky.  Since then he has had decreased range of motion that pinky as well as pain and mild swelling.  On exam, patient is noted to have a mildly edematous PIP joint on the left pinky with limited ROM.  No erythema noted.  Mild tenderness to palpation.  No other injuries noted on examination.  Unremarkable physical exam otherwise.  Normal cap refill in all fingers and normal radial pulse 2+ bilaterally.  X-ray was notable for some Mild periarticular soft tissue swelling with no fracture  Differential diagnoses prior to evaluation: Fracture, dislocation, ligamentous injury, muscle strain, articular injury  Past Medical History / Social History / Additional history: Chart reviewed. Pertinent results include: Bipolar 2, borderline personality sorter,  Medications / Treatment: Buddy tape was provided for pinky and ring finger on left hand   Disposition: After consideration of the diagnostic results and the patients response to treatment, I feel that the patient bit from discharge and treatment as above.   emergency department workup does not suggest an emergent condition requiring admission or immediate intervention beyond what has been  performed at this time. The plan is: Buddy tape fingers, RICE, return for new or worsening symptoms. The patient is safe for discharge and has been instructed to return immediately for worsening symptoms, change in symptoms or any other concerns.  Final Clinical Impression(s) / ED Diagnoses Final diagnoses:  Jammed interphalangeal joint of finger of left hand, initial encounter    Rx / DC Orders ED Discharge Orders     None         Lavonia Drafts 12/16/23 2338    Rolan Bucco, MD 12/17/23 1202

## 2024-03-21 ENCOUNTER — Other Ambulatory Visit: Payer: Self-pay | Admitting: Family Medicine

## 2024-03-21 ENCOUNTER — Ambulatory Visit: Admitting: Family Medicine

## 2024-03-21 ENCOUNTER — Ambulatory Visit (HOSPITAL_BASED_OUTPATIENT_CLINIC_OR_DEPARTMENT_OTHER)
Admission: RE | Admit: 2024-03-21 | Discharge: 2024-03-21 | Disposition: A | Discharge status: Home / Self Care | Source: Ambulatory Visit | Attending: Family Medicine | Admitting: Family Medicine

## 2024-03-21 ENCOUNTER — Encounter: Payer: Self-pay | Admitting: Family Medicine

## 2024-03-21 VITALS — BP 128/90 | HR 93 | Temp 98.2°F | Resp 18 | Ht 69.0 in | Wt 220.2 lb

## 2024-03-21 DIAGNOSIS — R0683 Snoring: Secondary | ICD-10-CM | POA: Diagnosis not present

## 2024-03-21 DIAGNOSIS — F17200 Nicotine dependence, unspecified, uncomplicated: Secondary | ICD-10-CM

## 2024-03-21 DIAGNOSIS — R051 Acute cough: Secondary | ICD-10-CM | POA: Insufficient documentation

## 2024-03-21 DIAGNOSIS — R5383 Other fatigue: Secondary | ICD-10-CM

## 2024-03-21 DIAGNOSIS — J9801 Acute bronchospasm: Secondary | ICD-10-CM

## 2024-03-21 LAB — COMPREHENSIVE METABOLIC PANEL WITH GFR
AG Ratio: 2.3 (calc) (ref 1.0–2.5)
ALT: 18 U/L (ref 9–46)
AST: 13 U/L (ref 10–40)
Albumin: 4.8 g/dL (ref 3.6–5.1)
Alkaline phosphatase (APISO): 79 U/L (ref 36–130)
BUN: 13 mg/dL (ref 7–25)
CO2: 29 mmol/L (ref 20–32)
Calcium: 9.7 mg/dL (ref 8.6–10.3)
Chloride: 103 mmol/L (ref 98–110)
Creat: 1.13 mg/dL (ref 0.60–1.24)
Globulin: 2.1 g/dL (ref 1.9–3.7)
Glucose, Bld: 95 mg/dL (ref 65–99)
Potassium: 4.9 mmol/L (ref 3.5–5.3)
Sodium: 139 mmol/L (ref 135–146)
Total Bilirubin: 0.8 mg/dL (ref 0.2–1.2)
Total Protein: 6.9 g/dL (ref 6.1–8.1)
eGFR: 94 mL/min/{1.73_m2} (ref >=60)

## 2024-03-21 LAB — CBC WITH DIFFERENTIAL/PLATELET
Absolute Lymphocytes: 1930 {cells}/uL (ref 850–3900)
Absolute Monocytes: 623 {cells}/uL (ref 200–950)
Basophils Absolute: 74 {cells}/uL (ref 0–200)
Basophils Relative: 1.1 %
Eosinophils Absolute: 121 {cells}/uL (ref 15–500)
Eosinophils Relative: 1.8 %
HCT: 48.2 % (ref 38.5–50.0)
Hemoglobin: 16 g/dL (ref 13.2–17.1)
MCH: 31.1 pg (ref 27.0–33.0)
MCHC: 33.2 g/dL (ref 32.0–36.0)
MCV: 93.8 fL (ref 80.0–100.0)
MPV: 11.3 fL (ref 7.5–12.5)
Monocytes Relative: 9.3 %
Neutro Abs: 3953 {cells}/uL (ref 1500–7800)
Neutrophils Relative %: 59 %
Platelets: 237 10*3/uL (ref 140–400)
RBC: 5.14 10*6/uL (ref 4.20–5.80)
RDW: 12.2 % (ref 11.0–15.0)
Total Lymphocyte: 28.8 %
WBC: 6.7 10*3/uL (ref 3.8–10.8)

## 2024-03-21 LAB — LIPID PANEL
Cholesterol: 213 mg/dL — ABNORMAL HIGH (ref <200)
HDL: 36 mg/dL — ABNORMAL LOW (ref >=40)
LDL Cholesterol (Calc): 153 mg/dL — ABNORMAL HIGH
Non-HDL Cholesterol (Calc): 177 mg/dL — ABNORMAL HIGH (ref <130)
Total CHOL/HDL Ratio: 5.9 (calc) — ABNORMAL HIGH (ref <5.0)
Triglycerides: 118 mg/dL (ref <150)

## 2024-03-21 LAB — TSH: TSH: 2.02 m[IU]/L (ref 0.40–4.50)

## 2024-03-21 MED ORDER — AIRSUPRA 90-80 MCG/ACT IN AERO: 2.0000 | INHALATION_SPRAY | Freq: Four times a day (QID) | RESPIRATORY_TRACT | 3 refills | Status: AC | PRN

## 2024-03-21 NOTE — Progress Notes (Signed)
 +  Established Patient Office Visit  Subjective   Patient ID: Shane [name], male    DOB: [birth year]  Age: 23 y.o. MRN: [id number]  Chief Complaint  Patient presents with   Vaping    Pt states having cough attacks and states having SOB just while talking. Pt states tired and exhausted.     HPI Discussed the use of AI scribe software for clinical note transcription with the patient, who gave verbal consent to proceed.  History of Present Illness Shane [name] is a 23 year old male who presents with cough and shortness of breath after reducing vaping.  He has been experiencing cough attacks and shortness of breath, particularly after reducing his vaping habit. These symptoms occur even during conversations with coworkers, where he feels 'out of it' and needs to pause to catch his breath.  He has been vaping for about two years, initially off and on, but consistently for the last six to seven months. He has recently reduced his usage from three disposable cartridges a month to one. He uses disposable vapes containing nicotine but is unsure of the strength.  He describes feeling exhausted despite sleeping for ten hours, waking up feeling 'out of it'. He believes he snores but is unaware of any episodes of apnea. His father snores, and his grandfather, who was a smoker, passed away from lung cancer. His grandmother is concerned about his vaping due to this family history.  He denies wheezing but experiences shortness of breath and sometimes feels tightness when taking deep breaths. He has a family history of asthma, as his father and sister have the condition.   Patient Active Problem List   Diagnosis Date Noted   Anxiety disorder, unspecified 01/14/2023   Borderline personality disorder (HCC) 08/11/2021   Hypochondriasis 06/11/2019   Bipolar 2 disorder (HCC) 04/29/2019   Attention deficit hyperactivity disorder (ADHD) 04/29/2019   Past Medical History:  Diagnosis Date   ADHD     Anxiety    Compressed spine fracture (HCC) 2015   L6   Depression    Past Surgical History:  Procedure Laterality Date   NO PAST SURGERIES     Social History   Tobacco Use   Smoking status: Never   Smokeless tobacco: Never  Vaping Use   Vaping status: Every Day   Start date: 03/21/2022   Devices: disposable  Substance Use Topics   Drug use: No   Social History   Socioeconomic History   Marital status: Single    Spouse name: Not on file   Number of children: Not on file   Years of education: Not on file   Highest education level: Not on file  Occupational History   Not on file  Tobacco Use   Smoking status: Never   Smokeless tobacco: Never  Vaping Use   Vaping status: Every Day   Start date: 03/21/2022   Devices: disposable  Substance and Sexual Activity   Alcohol use: Not on file   Drug use: No   Sexual activity: Never  Other Topics Concern   Not on file  Social History Narrative   Not on file   Social Drivers of Health   Financial Resource Strain: Not on file  Food Insecurity: Not on file  Transportation Needs: Not on file  Physical Activity: Not on file  Stress: Not on file  Social Connections: Not on file  Intimate Partner Violence: Not on file   Family Status  Relation Name Status   Mother  Alive   Father  Alive   MGM  Deceased   MGF  Deceased   PGM  Alive   PGF  Alive  No partnership data on file   Family History  Problem Relation Age of Onset   Thyroid  disease Mother    Anxiety disorder Mother    Depression Mother    Hypertension Father    Hyperlipidemia Father    Hypertension Paternal Grandmother    Hyperlipidemia Paternal Grandfather    Allergies  Allergen Reactions   Amoxicillin    Cantaloupe (Diagnostic)       Review of Systems  Constitutional:  Positive for malaise/fatigue. Negative for fever.  HENT:  Negative for congestion.   Eyes:  Negative for blurred vision.  Respiratory:  Positive for cough and shortness of breath.  Negative for sputum production.   Cardiovascular:  Negative for chest pain, palpitations and leg swelling.  Gastrointestinal:  Negative for vomiting.  Musculoskeletal:  Negative for back pain.  Skin:  Negative for rash.  Neurological:  Negative for loss of consciousness and headaches.      Objective:     BP (!) 128/90 (BP Location: Left Arm, Patient Position: Sitting, Cuff Size: Normal)   Pulse 93   Temp 98.2 F (36.8 C) (Oral)   Resp 18   Ht 5' 9 (1.753 m)   Wt 220 lb 3.2 oz (99.9 kg)   SpO2 99%   BMI 32.52 kg/m                                           Physical Exam Vitals and nursing note reviewed.  Constitutional:      General: He is not in acute distress.    Appearance: Normal appearance. He is well-developed.  HENT:     Head: Normocephalic and atraumatic.  Eyes:     General: No scleral icterus.       Right eye: No discharge.        Left eye: No discharge.  Cardiovascular:     Rate and Rhythm: Normal rate and regular rhythm.     Heart sounds: No murmur heard. Pulmonary:     Effort: Pulmonary effort is normal. No respiratory distress.     Breath sounds: Decreased breath sounds present. No wheezing.  Musculoskeletal:        General: Normal range of motion.     Cervical back: Normal range of motion and neck supple.     Right lower leg: No edema.     Left lower leg: No edema.  Skin:    General: Skin is warm and dry.  Neurological:     Mental Status: He is alert and oriented to person, place, and time.  Psychiatric:        Mood and Affect: Mood normal.        Behavior: Behavior normal.        Thought Content: Thought content normal.        Judgment: Judgment normal.      No results found for any visits on 03/21/24.  Last CBC Lab Results  Component Value Date   WBC 5.3 05/26/2023   HGB 16.0 05/26/2023   HCT 47.6 05/26/2023   MCV 92.9 05/26/2023   MCH 31.8 05/08/2020   RDW 13.0 05/26/2023   PLT 232.0  05/26/2023   Last metabolic panel Lab Results  Component Value Date   GLUCOSE  93 05/26/2023   NA 138 05/26/2023   K 5.1 05/26/2023   CL 99 05/26/2023   CO2 32 05/26/2023   BUN 17 05/26/2023   CREATININE 1.16 05/26/2023   GFR 89.30 05/26/2023   CALCIUM 9.8 05/26/2023   PROT 7.0 05/26/2023   ALBUMIN 4.4 05/26/2023   BILITOT 1.2 05/26/2023   ALKPHOS 101 05/26/2023   AST 17 05/26/2023   ALT 21 05/26/2023   ANIONGAP 9 05/07/2020   Last lipids Lab Results  Component Value Date   CHOL 207 (H) 05/26/2023   HDL 47.50 05/26/2023   LDLCALC 144 (H) 05/26/2023   TRIG 77.0 05/26/2023   CHOLHDL 4 05/26/2023   Last hemoglobin A1c Lab Results  Component Value Date   HGBA1C 5.4 04/01/2020   Last thyroid  functions Lab Results  Component Value Date   TSH 2.47 02/02/2022   Last vitamin D  Lab Results  Component Value Date   VD25OH 23.90 (L) 09/20/2018   Last vitamin B12 and Folate No results found for: VITAMINB12, FOLATE    The ASCVD Risk score (Arnett DK, et al., 2019) failed to calculate for the following reasons:   The 2019 ASCVD risk score is only valid for ages 43 to 26    Assessment & Plan:   Problem List Items Addressed This Visit   None Visit Diagnoses       Vaping nicotine dependence, non-tobacco product    -  Primary   Relevant Medications   Albuterol-Budesonide (AIRSUPRA) 90-80 MCG/ACT AERO   Other Relevant Orders   Ambulatory referral to Pulmonology   DG Chest 2 View   TSH   Lipid panel   Comprehensive metabolic panel with GFR   CBC with Differential/Platelet     Acute cough       Relevant Medications   Albuterol-Budesonide (AIRSUPRA) 90-80 MCG/ACT AERO   Other Relevant Orders   Ambulatory referral to Pulmonology   DG Chest 2 View     Other fatigue       Relevant Orders   TSH   Lipid panel   Comprehensive metabolic panel with GFR   CBC with Differential/Platelet     Snoring       Relevant Orders   Ambulatory referral to Pulmonology      Bronchospasm       Relevant Medications   Albuterol-Budesonide (AIRSUPRA) 90-80 MCG/ACT AERO     Assessment and Plan Assessment & Plan Vaping-related respiratory symptoms   He has been vaping for approximately two years, with consistent use for six to seven months. He experiences cough attacks, shortness of breath, and fatigue, which persist despite reduced vaping. Examination reveals decreased breath sounds without wheezing, suggesting possible vaping-related lung injury or irritation. Vaping can lead to conditions such as emphysema, characterized by chronic breathing difficulties and potential need for oxygen therapy and inhalers. Order a chest x-ray to evaluate lung condition. Prescribe an inhaler for symptomatic relief. Encourage complete cessation of vaping. Refer to a pulmonologist for further evaluation and management.  Fatigue   He reports persistent fatigue despite adequate sleep. There is no known history of sleep apnea, but he does snore. Fatigue may be related to respiratory issues or other underlying conditions. Order blood work to investigate potential causes.  General Health Maintenance   There is a family history of lung cancer, with his grandfather having died from the condition. Discussed the importance of quitting vaping to prevent similar health issues. Encourage smoking cessation to reduce the risk of lung cancer and other respiratory diseases.  No follow-ups on file.    Tallen Schnorr R Lowne Chase, DO

## 2024-03-21 NOTE — Patient Instructions (Signed)
Electronic Cigarette Information  Electronic cigarettes, or e-cigarettes, are battery-operated devices that deliver nicotine--a very addictive drug--to the body. They come in many shapes, including in the shape of a cigarette, pipe, pen, and even a USB memory stick. Electronic cigarettes may be called e-cigs, e-hookahs, vape pens, and electronic nicotine delivery systems (ENDS). E-cigarettes have a cartridge that contains a liquid form of nicotine. When a person uses the device, the liquid heats up. It then becomes a vapor that a person inhales. Using e-cigarettes may be called vaping. Nicotine is thought to increase your risk for certain types of cancer. In addition to nicotine, e-cigarettes may contain other harmful and cancer-causing chemicals, including: Formaldehyde. Acetaldehyde. Heavy metals. Ultrafine particles that can get inhaled deep into the lungs. Chemical colorings and flavorings. It is not clear how much nicotine you get when vaping, and it is hard to know what chemicals are in the vaping liquids. The health effects of vaping are not completely known, but you should be aware of the possible dangers of using these products. Some people may use e-cigarettes to quit smoking tobacco. However, this has not been proven to work, and the Education officer, environmental (FDA) has not approved e-cigarettes for this purpose. How can using electronic cigarettes affect me? You may be at risk for developing a dangerous lung disease. There are reports of an increasing number of cases involving serious lung problems, and even death, associated with e-cigarette use. Your risk may be even higher if you: Buy e-cigarettes or vaping oils off the street. Add any substances to the e-cigarettes that are not intended by the manufacturer. Vaping may make you crave nicotine. Nicotine does the following: Changes your blood sugar levels. Increases your heart rate, blood pressure, and breathing rate. Increases your  risk of developing blood clots (hypercoagulable state) and diabetes. Increases your risk of gum disease that may lead to losing teeth. If you smoke e-cigarettes, you may be more likely to start smoking or to smoke more tobacco cigarettes. Becoming addicted to nicotine may make your brain more sensitive to other addictive drugs. You may move to other addictive substances. You may be in danger of overdosing on nicotine. Nicotine poisoning can cause nausea, vomiting, seizures, and trouble breathing. Vaping has also been linked to decreases in memory and attention span in children and teens. If you are pregnant, the nicotine in e-cigarettes may be harmful to your baby. Nicotine can cause: Brain or lung problems for your baby. Your baby to be born too early. Your baby to be born with a low birth weight. What actions can I take to stop vaping? If you can, stop vaping on your own before you become addicted to nicotine. If you need help quitting, ask your health care provider. There are three effective ways to fight nicotine addiction: Nicotine replacement therapy. Using nicotine gum or a nicotine patch blocks your craving for nicotine. Over time, you can reduce the amount of nicotine you use until you can stop using nicotine completely without having cravings. Prescription medicines approved to fight nicotine addiction. These stop nicotine cravings or block the effects of nicotine. Behavioral therapy. This may include: A self-help smoking cessation program. Individual or group therapy. A smoking cessation support group. There are several national programs to help you quit smoking or vaping. These include: Text message programs, such as SmokefreeTXT. Apps for mobile phones, including the free quitSTART app. Hotlines, such as 1-800-QUIT-NOW 432-603-7531). Where to find support You can get support at these sites: U.S. Department  of Health and Human Services: smokefree.gov American Lung Association:  www.lung.org Where to find more information Learn more about e-cigarettes from: [employer] on Drug Abuse: http://www.price-smith.com/ Centers for Disease Control and Prevention: FootballExhibition.com.br Summary E-cigarettes can cause nicotine addiction. E-cigarettes are not approved as a way to stop smoking. They are not a risk-free alternative to smoking tobacco. There are reports of an increasing number of cases involving serious lung problems, and even death, associated with e-cigarette use. If you can stop vaping on your own, do it before you become addicted to nicotine. If you need help quitting, ask your health care provider. There are various methods and programs that can help you stop smoking or vaping. This information is not intended to replace advice given to you by your health care provider. Make sure you discuss any questions you have with your health care provider. Document Revised: 06/02/2021 Document Reviewed: 06/02/2021 Elsevier Patient Education  2024 [employer].

## 2024-03-25 ENCOUNTER — Other Ambulatory Visit (HOSPITAL_COMMUNITY): Payer: Self-pay

## 2024-03-25 ENCOUNTER — Telehealth: Payer: Self-pay

## 2024-03-25 ENCOUNTER — Other Ambulatory Visit: Payer: Self-pay | Admitting: Family Medicine

## 2024-03-25 ENCOUNTER — Ambulatory Visit: Payer: Self-pay | Admitting: Family Medicine

## 2024-03-25 DIAGNOSIS — J9801 Acute bronchospasm: Secondary | ICD-10-CM

## 2024-03-25 DIAGNOSIS — R051 Acute cough: Secondary | ICD-10-CM

## 2024-03-25 DIAGNOSIS — F17200 Nicotine dependence, unspecified, uncomplicated: Secondary | ICD-10-CM

## 2024-03-25 NOTE — Telephone Encounter (Signed)
 Pharmacy Patient Advocate Encounter   Received notification from RX Request Messages that prior authorization for Airsupra  90-22mcg is required/requested.   Insurance verification completed.   The patient is insured through [facility] .   Per test claim: PA required; PA submitted to above mentioned insurance via CoverMyMeds Key/confirmation #/EOC [id number] Status is pending

## 2024-03-27 ENCOUNTER — Encounter: Payer: Self-pay | Admitting: Family Medicine

## 2024-03-28 ENCOUNTER — Other Ambulatory Visit: Payer: Self-pay | Admitting: Family Medicine

## 2024-03-28 MED ORDER — ALBUTEROL SULFATE HFA 108 (90 BASE) MCG/ACT IN AERS: 2.0000 | INHALATION_SPRAY | Freq: Four times a day (QID) | RESPIRATORY_TRACT | 0 refills | Status: DC | PRN

## 2024-03-28 NOTE — Telephone Encounter (Signed)
 Pharmacy Patient Advocate Encounter  Received notification from [facility] that Prior Authorization for Airsupra  90-80MCG/ACT aerosol  has been DENIED.  Full denial letter will be uploaded to the media tab. See denial reason below.   PA #/Case ID/Reference #: [id number]

## 2024-06-04 ENCOUNTER — Telehealth: Payer: Self-pay

## 2024-06-04 ENCOUNTER — Ambulatory Visit (INDEPENDENT_AMBULATORY_CARE_PROVIDER_SITE_OTHER)

## 2024-06-04 VITALS — BP 126/84 | HR 83 | Ht 70.0 in | Wt 223.4 lb

## 2024-06-04 DIAGNOSIS — R0683 Snoring: Secondary | ICD-10-CM | POA: Diagnosis not present

## 2024-06-04 DIAGNOSIS — J4530 Mild persistent asthma, uncomplicated: Secondary | ICD-10-CM

## 2024-06-04 DIAGNOSIS — F17210 Nicotine dependence, cigarettes, uncomplicated: Secondary | ICD-10-CM | POA: Diagnosis not present

## 2024-06-04 DIAGNOSIS — Z716 Tobacco abuse counseling: Secondary | ICD-10-CM

## 2024-06-04 MED ORDER — BUDESONIDE-FORMOTEROL FUMARATE 160-4.5 MCG/ACT IN AERO: 2.0000 | INHALATION_SPRAY | Freq: Two times a day (BID) | RESPIRATORY_TRACT | 6 refills | Status: AC

## 2024-06-04 NOTE — Patient Instructions (Addendum)
 Notification of test results are managed in the following manner: If there are any recommendations or changes to the plan of care discussed in office today, we will contact you and let you know what they are. If you do not hear from us , then your results are normal/expected and you can view them through your MyChart account, or a letter will be sent to you. Thank you again for trusting us  with your care  Pulmonary.  Take symbicort  2 puff twice daily and continue using airspura as needed.  Stop vaping.   Home sleep study ordered to rule out OSA.

## 2024-06-04 NOTE — Telephone Encounter (Signed)
 Called pt to see if he would be available to move to Dr. Lavena 2:00 spot instead of 4:00, per Dr. Lavena request. No answer, LVM requesting call back.

## 2024-06-04 NOTE — Progress Notes (Signed)
 New Patient Pulmonology Office Visit   Subjective:  Patient ID: Shane [name], male    DOB: 2001-04-27  MRN: [id number]  Referred by: Antonio Cyndee Jamee JONELLE, *  CC:  Chief Complaint  Patient presents with   Consult    Pt states he wakes up in the middle of the night.  Pt also states he has been vaping for a while and gets winded very easily.     HPI Shane [name] is a 23 y.o. male with depresssion and anxiety, bipolar disorder, HLD.   SOB exertion for 3-4 months. Cough no phlegm. Coughs at night and wakes up 1-2 times/week. Sneezing when in crawl space. Was on airspura 2 puff day. It did helped with both cough and sob. No wheezing. Previous chest tightness. Can play softball for an hour without getting sob. 3 flights make him SOB.   Dad and sister with asthma. Has seasonal allergies. Pollen can cause him sneezing and throat itching.   No prior PFT.   CXR 03/2024: neg. Eos 121.   Mouth breather: unsure.  Preferred sleeping position: side.   Sleep related Symptoms:  Snoring- yes.  Witnessed apnea- no Gasping/choking- no morning HA/dry mouth- n/y tired on awakening, excessive daytime sleepiness- feels tired in middle of day.  Restless legs- no  Sleep routine:  -Bed: 11-mn. 1-2 hours to fall asleep.  -Nocturnal awakenings: 2 times.  -Wake: 6.30 a. Wakes to an alarm. Feels refreshed after waking.  -Napping: 3-4 hours/day.  -sleep hygiene: playing video games.    Lung Health: Functional status: see above.  Covid vaccine: no Influenza vaccine: no Pneumonococcal vaccine: no Smoking: non smoker but vaping for 1-2 years.  Occupational exposure/pets: works in [employer]. Is around insulation and molds. 1 dog and 2 cats. No exposures.       06/04/2024    3:00 PM  Results of the Epworth flowsheet  Sitting and reading 3  Watching TV 0  Sitting, inactive in a public place (e.g. a theatre or a meeting) 0  As a passenger in a car for an hour without a break 0  Lying down  to rest in the afternoon when circumstances permit 3  Sitting and talking to someone 0  Sitting quietly after a lunch without alcohol 0  In a car, while stopped for a few minutes in traffic 0  Total score 6    Allergies: Amoxicillin and Cantaloupe (diagnostic)  Current Outpatient Medications:    Albuterol -Budesonide  (AIRSUPRA ) 90-80 MCG/ACT AERO, Inhale 2 puffs into the lungs every 6 (six) hours as needed., Disp: 10.7 g, Rfl: 3   atomoxetine  (STRATTERA ) 80 MG capsule, TAKE 1 CAPSULE BY MOUTH EVERY DAY, Disp: 90 capsule, Rfl: 1   budesonide -formoterol  (SYMBICORT ) 160-4.5 MCG/ACT inhaler, Inhale 2 puffs into the lungs 2 (two) times daily., Disp: 1 each, Rfl: 6   buPROPion (WELLBUTRIN SR) 150 MG 12 hr tablet, Take 150 mg by mouth 2 (two) times daily., Disp: , Rfl:    Clobetasol  Propionate 0.05 % shampoo, Use twice daily for 14 days., Disp: 118 mL, Rfl: 0   FLUoxetine  (PROZAC ) 40 MG capsule, TAKE 1 CAPSULE BY MOUTH EVERY DAY, Disp: 90 capsule, Rfl: 1   ibuprofen  (ADVIL ,MOTRIN ) 800 MG tablet, Take 1 tablet (800 mg total) by mouth 3 (three) times daily., Disp: 21 tablet, Rfl: 0   lamoTRIgine  (LAMICTAL ) 200 MG tablet, Take 1 tablet (200 mg total) by mouth at bedtime., Disp: 90 tablet, Rfl: 0   loratadine (CLARITIN) 10 MG tablet, Take 10 mg  by mouth daily., Disp: , Rfl:    lurasidone  (LATUDA ) 20 MG TABS tablet, Take 20 mg by mouth daily., Disp: , Rfl:    meloxicam  (MOBIC ) 15 MG tablet, Take 1 tablet (15 mg total) by mouth daily., Disp: 30 tablet, Rfl: 0 Past Medical History:  Diagnosis Date   ADHD    Anxiety    Compressed spine fracture (HCC) 2015   L6   Depression    Past Surgical History:  Procedure Laterality Date   NO PAST SURGERIES     Family History  Problem Relation Age of Onset   Thyroid  disease Mother    Anxiety disorder Mother    Depression Mother    Hypertension Father    Hyperlipidemia Father    Hypertension Paternal Grandmother    Hyperlipidemia Paternal Grandfather     Social History   Socioeconomic History   Marital status: Single    Spouse name: Not on file   Number of children: Not on file   Years of education: Not on file   Highest education level: Not on file  Occupational History   Not on file  Tobacco Use   Smoking status: Every Day    Types: E-cigarettes   Smokeless tobacco: Never   Tobacco comments:    Vapes daily, about a year and a half  Vaping Use   Vaping status: Every Day   Start date: 03/21/2022   Devices: disposable  Substance and Sexual Activity   Alcohol use: Not on file   Drug use: No   Sexual activity: Never  Other Topics Concern   Not on file  Social History Narrative   Not on file   Social Drivers of Health   Financial Resource Strain: Not on file  Food Insecurity: Not on file  Transportation Needs: Not on file  Physical Activity: Not on file  Stress: Not on file  Social Connections: Not on file  Intimate Partner Violence: Not on file       Objective:  BP 126/84   Pulse 83   Ht 5' 10 (1.778 m)   Wt 223 lb 6.4 oz (101.3 kg)   SpO2 96% Comment: RA  BMI 32.05 kg/m  BMI Readings from Last 3 Encounters:  06/04/24 32.05 kg/m  03/21/24 32.52 kg/m  12/16/23 31.45 kg/m    General: not in distress patient appearing healthy HEENT: MMS 4. Narrow airway. Tonsillar grade 1-2.  Lungs: clear to auscultation bilaterally.  Heart: regular rate rhythm, no murmur appreciated.  Abdomen: non tender, non distended. Normal BS.  Neuro: axox3.  Moves all extremity.    Diagnostic Review:  Last metabolic panel Lab Results  Component Value Date   GLUCOSE 95 03/21/2024   NA 139 03/21/2024   K 4.9 03/21/2024   CL 103 03/21/2024   CO2 29 03/21/2024   BUN 13 03/21/2024   CREATININE 1.13 03/21/2024   EGFR 94 03/21/2024   CALCIUM 9.7 03/21/2024   PROT 6.9 03/21/2024   ALBUMIN 4.4 05/26/2023   BILITOT 0.8 03/21/2024   ALKPHOS 101 05/26/2023   AST 13 03/21/2024   ALT 18 03/21/2024   ANIONGAP 9 05/07/2020        Assessment & Plan:   Assessment & Plan Mild persistent asthma without complication Take symbicort  2 puff twice daily and continue using airspura as needed.  Stop vaping.  PFT Snoring Has fatigue. R/o OSA.  Home sleep study ordered to rule out OSA.  I discussed with the patient the pathophysiology of obstructive sleep apnea, its  association with weight, and its negative effects on hypertension, diabetes, mental health, A-fib, stroke if left untreated.  I briefly discussed the treatment options for obstructive sleep apnea  Encounter for smoking cessation counseling Discussed need for stopping smoking for >3 min. Will offer replacement therapy or chantix in future if not able to quit vaping.   Biphasic Sleep pattern: - advised to avoid afternoon naps.  - sleep hygiene discussed.  - will need to work on it if patient is interested using light therapy.   Orders Placed This Encounter  Procedures   Pulmonary function test   Home sleep test    Return in about 3 months (around 09/03/2024).   Jayel Scaduto, MD

## 2024-08-28 ENCOUNTER — Encounter

## 2024-08-28 ENCOUNTER — Ambulatory Visit

## 2024-10-22 ENCOUNTER — Ambulatory Visit: Payer: Self-pay | Admitting: *Deleted

## 2024-10-23 ENCOUNTER — Ambulatory Visit: Admitting: Family Medicine

## 2024-10-23 ENCOUNTER — Encounter: Payer: Self-pay | Admitting: Family Medicine

## 2024-10-23 VITALS — BP 128/80 | HR 74 | Temp 98.0°F | Resp 16 | Ht 70.0 in | Wt 221.6 lb

## 2024-10-23 DIAGNOSIS — M79605 Pain in left leg: Secondary | ICD-10-CM

## 2024-10-23 DIAGNOSIS — M79652 Pain in left thigh: Secondary | ICD-10-CM

## 2024-10-23 MED ORDER — MELOXICAM 15 MG PO TABS: 15.0000 mg | ORAL_TABLET | Freq: Every day | ORAL | 0 refills | Status: AC

## 2024-10-23 NOTE — Patient Instructions (Addendum)
 Heat (pad or rice pillow in microwave) over affected area, 10-15 minutes twice daily.   Ice/cold pack over area for 10-15 min twice daily.  OK to take Tylenol  1000 mg (2 extra strength tabs) or 975 mg (3 regular strength tabs) every 6 hours as needed.  Compression can be helpful with that.   Let us  know if you need anything.  Semimembranosus Tendinitis Rehab  It is normal to feel mild stretching, pulling, tightness, or discomfort as you do these exercises, but you should stop right away if you feel sudden pain or your pain gets worse.  Stretching and range of motion exercises These exercises warm up your muscles and joints and improve the movement and flexibility of your thigh. These exercises also help to relieve pain, numbness, and tingling. Exercise A: Hamstring stretch, supine    Lie on your back. Loop a belt or towel across the ball of your left / right foot The ball of your foot is on the walking surface, right under your toes. Straighten your left / right knee and slowly pull on the belt to raise your leg. Stop when you feel a gentle stretch behind your left / right knee or thigh. Do not allow the knee to bend. Keep your other leg flat on the floor. Hold this position for 30 seconds. Repeat 2 times. Complete this exercise 3 times a week. Strengthening exercises These exercises build strength and endurance in your thigh. Endurance is the ability to use your muscles for a long time, even after they get tired. Exercise B: Straight leg raises (hip extensors) Lie on your belly on a bed or a firm surface with a pillow under your hips. Bend your left / right knee so your foot is straight up in the air. Squeeze your buttock muscles and lift your left / right thigh off the bed. Do not let your back arch. Hold this position for 3 seconds. Slowly return to the starting position. Let your muscles relax completely before you do another repetition. Repeat 2 times. Complete this exercise 3  times a week. Exercise C: Bridge (hip extensors)     Lie on your back on a firm surface with your knees bent and your feet flat on the floor. Tighten your buttocks muscles and lift your bottom off the floor until your trunk is level with your thighs. You should feel the muscles working in your buttocks and the back of your thighs. If you do not feel these muscles, slide your feet 1-2 inches (2.5-5 cm) farther away from your buttocks. Do not arch your back. Hold this position for 3 seconds. Slowly lower your hips to the starting position. Let your buttocks muscles relax completely between repetitions. If this exercise is too easy, try doing it with your arms crossed over your chest. Repeat 2 times. Complete this exercise 3 times a week. Exercise D: Hamstring eccentric, prone Lie on your belly on a bed or on the floor. Start with your legs straight. Cross your legs at the ankles with your left / right leg on top. Using your bottom leg to do the work, bend both knees. Using just your left / right leg alone, slowly lower your leg back down toward the bed. Add a 5 lb weight as told by your health care provider. Let your muscles relax completely between repetitions. Repeat 2 times. Complete this exercise 3 times a week. Exercise E: Squats Stand in front of a table, with your feet and knees pointing straight ahead.  You may rest your hands on the table for balance but not for support. Slowly bend your knees and lower your hips like you are going to sit in a chair. Keep your thighs straight or pointed slightly outward. Keep your weight over your heels, not over your toes. Keep your lower legs upright so they are parallel with the table legs. Do not let your hips go lower than your knees. Stop when your knees are bent to the shape of an upside-down letter L (90 degree angle). Do not bend lower than told by your health care provider. If your knee pain increases, do not bend as low. Hold the squat  position 1-2 seconds. Slowly push with your legs to return to standing. Do not use your hands to pull yourself to standing. Repeat 2 times. Complete this exercise 3 times a week. Make sure you discuss any questions you have with your health care provider. Document Released: 09/05/2005 Document Revised: 05/12/2016 Document Reviewed: 06/09/2015 Elsevier Interactive Patient Education  [employer].

## 2024-10-23 NOTE — Progress Notes (Signed)
 Musculoskeletal Exam  Patient: Shane [name] DOB: 2001-02-07  DOS: 10/23/2024  SUBJECTIVE:  Chief Complaint:   Chief Complaint  Patient presents with   Fall    Fall from [place] is a 24 y.o.  male for evaluation and treatment of L thigh pain.   Onset:  2 days ago. [place] off a roof and landed on L thigh/buttock area Location: posterior L thigh and lateral leg and ankle Character:  tightening, cramping  Progression of issue:  is unchanged Associated symptoms: bruising, swelling No redness or decreasing ROM Treatment: to date has been ice, OTC NSAIDS, and acetaminophen .   Neurovascular symptoms: no  Past Medical History:  Diagnosis Date   ADHD    Anxiety    Compressed spine fracture (HCC) 2015   L6   Depression     Objective: VITAL SIGNS: BP 128/80 (BP Location: Left Arm, Patient Position: Sitting)   Pulse 74   Temp 98 F (36.7 C) (Oral)   Resp 16   Ht 5' 10 (1.778 m)   Wt 221 lb 9.6 oz (100.5 kg)   SpO2 95%   BMI 31.80 kg/m  Constitutional: Well formed, well developed. No acute distress. Thorax & Lungs: No accessory muscle use Musculoskeletal: LLE.   Normal active range of motion: yes.   Normal passive range of motion: yes Tenderness to palpation: TTP over the proximal middle left quad, proximal hamstrings, mid peroneus longus There is no TTP over the distal quad, distal hamstring, point tenderness over the knee, tibial TTP, ischial tuberosity, sacral region, calf, or low back No pain with axial loading of the fibula or femur. Deformity: no Ecchymosis: Yes over the proximal left thigh Neurologic: Normal sensory function.  Gait is antalgic.  DTRs equal and symmetric lower extremities.  5/5 strength in lower extremities. Psychiatric: Normal mood. Age appropriate judgment and insight. Alert & oriented x 3.    Assessment:  Pain of left lower extremity - Plan: meloxicam  (MOBIC ) 15 MG tablet  Pain of left thigh - Plan: meloxicam  (MOBIC ) 15 MG  tablet  Plan: Stretches/exercises, heat, ice, compression, Mobic , Tylenol .  Do not think he fractured anything, would have figured out before now. F/u as originally scheduled. The patient voiced understanding and agreement to the plan.   Mabel [place], DO 10/23/24  10:02 AM
